# Patient Record
Sex: Female | Born: 1950
Health system: Southern US, Community
[De-identification: ages and names within clinical notes are randomized; demographics above are authoritative.]

## PROBLEM LIST (undated history)

## (undated) DIAGNOSIS — K219 Gastro-esophageal reflux disease without esophagitis: Secondary | ICD-10-CM

## (undated) DIAGNOSIS — I10 Essential (primary) hypertension: Secondary | ICD-10-CM

## (undated) HISTORY — PX: ANKLE SURGERY: SHX546

## (undated) HISTORY — PX: WRIST SURGERY: SHX841

## (undated) HISTORY — PX: TONSILLECTOMY: SUR1361

---

## 2019-11-20 ENCOUNTER — Encounter (INDEPENDENT_AMBULATORY_CARE_PROVIDER_SITE_OTHER): Payer: Self-pay | Admitting: *Deleted

## 2020-02-20 ENCOUNTER — Other Ambulatory Visit (INDEPENDENT_AMBULATORY_CARE_PROVIDER_SITE_OTHER): Payer: Self-pay | Admitting: *Deleted

## 2020-02-20 ENCOUNTER — Encounter (INDEPENDENT_AMBULATORY_CARE_PROVIDER_SITE_OTHER): Payer: Self-pay | Admitting: *Deleted

## 2020-02-22 ENCOUNTER — Other Ambulatory Visit: Payer: Self-pay

## 2020-02-22 ENCOUNTER — Ambulatory Visit (INDEPENDENT_AMBULATORY_CARE_PROVIDER_SITE_OTHER): Payer: Self-pay

## 2020-02-22 ENCOUNTER — Telehealth (INDEPENDENT_AMBULATORY_CARE_PROVIDER_SITE_OTHER): Payer: Self-pay | Admitting: *Deleted

## 2020-02-22 ENCOUNTER — Other Ambulatory Visit (INDEPENDENT_AMBULATORY_CARE_PROVIDER_SITE_OTHER): Payer: Self-pay | Admitting: *Deleted

## 2020-02-22 DIAGNOSIS — Z86010 Personal history of colonic polyps: Secondary | ICD-10-CM

## 2020-02-22 NOTE — Telephone Encounter (Signed)
Referring MD/PCP: hall   Procedure: tcs  Reason/Indication:  Hx polyps  Has patient had this procedure before?  Yes, 2017  If so, when, by whom and where?    Is there a family history of colon cancer?  no  Who?  What age when diagnosed?    Is patient diabetic?   no      Does patient have prosthetic heart valve or mechanical valve?  no  Do you have a pacemaker/defibrillator?  no  Has patient ever had endocarditis/atrial fibrillation? no  Does patient use oxygen? no  Has patient had joint replacement within last 12 months?  no  Is patient constipated or do they take laxatives? no  Does patient have a history of alcohol/drug use?  no  Is patient on blood thinner such as Coumadin, Plavix and/or Aspirin? no  Medications: lisinopril 20 mg daily, metoprolol 50 mg bid, alendronate 70 mg once a week, mvi daily, biotin daily, vit b daily, melatonin, zinc  Allergies: sulfur, thimerosol  Medication Adjustment per Dr Karilyn Cota:   Procedure date & time: 03/21/20

## 2020-02-23 ENCOUNTER — Telehealth (INDEPENDENT_AMBULATORY_CARE_PROVIDER_SITE_OTHER): Payer: Self-pay | Admitting: *Deleted

## 2020-02-23 MED ORDER — SUTAB 1479-225-188 MG PO TABS: 1.0000 | ORAL_TABLET | Freq: Once | ORAL | 0 refills | Status: AC

## 2020-02-23 NOTE — Telephone Encounter (Signed)
Ok to schedule.

## 2020-02-23 NOTE — Telephone Encounter (Signed)
Patient needs Sutab (copay card) ° °

## 2020-03-19 ENCOUNTER — Other Ambulatory Visit: Payer: Self-pay

## 2020-03-19 ENCOUNTER — Other Ambulatory Visit (HOSPITAL_COMMUNITY): Payer: Medicare Other

## 2020-03-19 ENCOUNTER — Other Ambulatory Visit (HOSPITAL_COMMUNITY)
Admission: RE | Admit: 2020-03-19 | Discharge: 2020-03-19 | Disposition: A | Discharge status: Home / Self Care | Payer: Medicare Other | Source: Ambulatory Visit | Attending: Internal Medicine | Admitting: Internal Medicine

## 2020-03-19 DIAGNOSIS — Z01812 Encounter for preprocedural laboratory examination: Secondary | ICD-10-CM | POA: Diagnosis present

## 2020-03-19 DIAGNOSIS — Z20822 Contact with and (suspected) exposure to covid-19: Secondary | ICD-10-CM | POA: Insufficient documentation

## 2020-03-20 LAB — SARS CORONAVIRUS 2 (TAT 6-24 HRS): SARS Coronavirus 2: NEGATIVE

## 2020-03-21 ENCOUNTER — Other Ambulatory Visit: Payer: Self-pay

## 2020-03-21 ENCOUNTER — Encounter (HOSPITAL_COMMUNITY)
Admission: RE | Disposition: A | Discharge status: Home / Self Care | Payer: Self-pay | Source: Home / Self Care | Attending: Internal Medicine

## 2020-03-21 ENCOUNTER — Encounter (HOSPITAL_COMMUNITY): Payer: Self-pay | Admitting: Internal Medicine

## 2020-03-21 ENCOUNTER — Ambulatory Visit (HOSPITAL_COMMUNITY)
Admission: RE | Admit: 2020-03-21 | Discharge: 2020-03-21 | Disposition: A | Discharge status: Home / Self Care | Payer: Medicare Other | Attending: Internal Medicine | Admitting: Internal Medicine

## 2020-03-21 DIAGNOSIS — Z86010 Personal history of colonic polyps: Secondary | ICD-10-CM | POA: Insufficient documentation

## 2020-03-21 DIAGNOSIS — Z7984 Long term (current) use of oral hypoglycemic drugs: Secondary | ICD-10-CM | POA: Diagnosis not present

## 2020-03-21 DIAGNOSIS — E785 Hyperlipidemia, unspecified: Secondary | ICD-10-CM | POA: Diagnosis not present

## 2020-03-21 DIAGNOSIS — K644 Residual hemorrhoidal skin tags: Secondary | ICD-10-CM | POA: Insufficient documentation

## 2020-03-21 DIAGNOSIS — E119 Type 2 diabetes mellitus without complications: Secondary | ICD-10-CM | POA: Insufficient documentation

## 2020-03-21 DIAGNOSIS — Z09 Encounter for follow-up examination after completed treatment for conditions other than malignant neoplasm: Secondary | ICD-10-CM | POA: Diagnosis present

## 2020-03-21 DIAGNOSIS — N9089 Other specified noninflammatory disorders of vulva and perineum: Secondary | ICD-10-CM | POA: Diagnosis not present

## 2020-03-21 DIAGNOSIS — I10 Essential (primary) hypertension: Secondary | ICD-10-CM | POA: Diagnosis not present

## 2020-03-21 DIAGNOSIS — Z881 Allergy status to other antibiotic agents status: Secondary | ICD-10-CM | POA: Diagnosis not present

## 2020-03-21 DIAGNOSIS — M810 Age-related osteoporosis without current pathological fracture: Secondary | ICD-10-CM | POA: Insufficient documentation

## 2020-03-21 DIAGNOSIS — Z882 Allergy status to sulfonamides status: Secondary | ICD-10-CM | POA: Insufficient documentation

## 2020-03-21 DIAGNOSIS — Z79899 Other long term (current) drug therapy: Secondary | ICD-10-CM | POA: Diagnosis not present

## 2020-03-21 DIAGNOSIS — K219 Gastro-esophageal reflux disease without esophagitis: Secondary | ICD-10-CM | POA: Diagnosis not present

## 2020-03-21 HISTORY — PX: COLONOSCOPY: SHX5424

## 2020-03-21 HISTORY — DX: Essential (primary) hypertension: I10

## 2020-03-21 HISTORY — DX: Gastro-esophageal reflux disease without esophagitis: K21.9

## 2020-03-21 LAB — GLUCOSE, CAPILLARY: Glucose-Capillary: 81 mg/dL (ref 70–99)

## 2020-03-21 SURGERY — COLONOSCOPY
Anesthesia: Moderate Sedation

## 2020-03-21 MED ORDER — MIDAZOLAM HCL 5 MG/5ML IJ SOLN
INTRAMUSCULAR | Status: AC
  Filled 2020-03-21: qty 10

## 2020-03-21 MED ORDER — STERILE WATER FOR IRRIGATION IR SOLN
Status: DC | PRN
  Administered 2020-03-21: 100 mL

## 2020-03-21 MED ORDER — MIDAZOLAM HCL 5 MG/5ML IJ SOLN
INTRAMUSCULAR | Status: DC | PRN
  Administered 2020-03-21: 2 mg via INTRAVENOUS
  Administered 2020-03-21 (×2): 1 mg via INTRAVENOUS
  Administered 2020-03-21: 2 mg via INTRAVENOUS

## 2020-03-21 MED ORDER — SODIUM CHLORIDE 0.9 % IV SOLN: INTRAVENOUS | Status: DC

## 2020-03-21 MED ORDER — MEPERIDINE HCL 50 MG/ML IJ SOLN
INTRAMUSCULAR | Status: DC | PRN
  Administered 2020-03-21 (×2): 25 mg via INTRAVENOUS

## 2020-03-21 MED ORDER — MEPERIDINE HCL 50 MG/ML IJ SOLN
INTRAMUSCULAR | Status: AC
  Filled 2020-03-21: qty 1

## 2020-03-21 NOTE — H&P (Signed)
Kathleen Walker is an 69 y.o. female.   Chief Complaint: Patient is here for colonoscopy. HPI: Patient is 69 year old Caucasian female who has a history of colonic polyps and is here for surveillance colonoscopy.  First exam was negative and she had exam 4 years ago when she 3 polyps removed.  He denies abdominal pain change in bowel habits or rectal bleeding. Family history is negative for CRC. Patient does not take aspirin or anticoagulants.  Past Medical History:  Diagnosis Date  . GERD (gastroesophageal reflux disease)   . Hypertension        Osteoporosis.      Hyperlipidemia.      Diabetes mellitus.  Past Surgical History:  Procedure Laterality Date  . ANKLE SURGERY Right    for polio  . TONSILLECTOMY    . WRIST SURGERY Right    fracture wrist, and hand fracture    History reviewed. No pertinent family history. Social History:  reports that she has never smoked. She has never used smokeless tobacco. No history on file for alcohol and drug.  Allergies:  Allergies  Allergen Reactions  . Sulfa Antibiotics     Ears and teeth pain   . Thimerosal     Whites of eyes turn blood red    Medications Prior to Admission  Medication Sig Dispense Refill  . alendronate (FOSAMAX) 70 MG tablet Take 70 mg by mouth every Monday.    Marland Kitchen atorvastatin (LIPITOR) 20 MG tablet Take 20 mg by mouth daily.    . Benfotiamine 150 MG CAPS Take 150 mg by mouth daily.    . Cholecalciferol (VITAMIN D) 50 MCG (2000 UT) tablet Take 2,000 Units by mouth daily.    . famotidine (PEPCID) 20 MG tablet Take 20 mg by mouth at bedtime as needed for heartburn or indigestion.    Marland Kitchen lisinopril (ZESTRIL) 20 MG tablet Take 20 mg by mouth daily.    . Melatonin 3 MG CAPS Take 6 mg by mouth at bedtime as needed (sleep).    . metFORMIN (GLUCOPHAGE) 500 MG tablet Take 500 mg by mouth 2 (two) times daily.    . metoprolol tartrate (LOPRESSOR) 50 MG tablet Take 50 mg by mouth 2 (two) times daily.    . Multiple Vitamin  (MULTIVITAMIN WITH MINERALS) TABS tablet Take 1 tablet by mouth daily.    Marland Kitchen zinc gluconate 50 MG tablet Take 50 mg by mouth 3 (three) times a week.      Results for orders placed or performed during the hospital encounter of 03/21/20 (from the past 48 hour(s))  Glucose, capillary     Status: None   Collection Time: 03/21/20  6:54 AM  Result Value Ref Range   Glucose-Capillary 81 70 - 99 mg/dL    Comment: Glucose reference range applies only to samples taken after fasting for at least 8 hours.   No results found.  Review of Systems  Blood pressure 134/78, temperature 98.1 F (36.7 C), temperature source Oral, resp. rate 16, height 5\' 8"  (1.727 m), SpO2 100 %. Physical Exam  Constitutional: She appears well-developed and well-nourished.  HENT:  Mouth/Throat: Oropharynx is clear and moist.  Eyes: Conjunctivae are normal. No scleral icterus.  Neck: No thyromegaly present.  Cardiovascular: Normal rate, regular rhythm and normal heart sounds.  No murmur heard. Respiratory: Effort normal and breath sounds normal.  GI:  Abdomen is full but soft and nontender with organomegaly or masses.  Musculoskeletal:        General: No edema.  Lymphadenopathy:    She has no cervical adenopathy.  Neurological: She is alert.  Skin: Skin is warm.     Assessment/Plan History of colonic adenomas. Surveillance colonoscopy.  Hildred Laser, MD 03/21/2020, 7:29 AM

## 2020-03-21 NOTE — Discharge Instructions (Signed)
Colonoscopy, Adult, Care After This sheet gives you information about how to care for yourself after your procedure. Your health care provider may also give you more specific instructions. If you have problems or questions, contact your health care provider. What can I expect after the procedure? After the procedure, it is common to have:  A small amount of blood in your stool for 24 hours after the procedure.  Some gas.  Mild cramping or bloating of your abdomen. Follow these instructions at home: Eating and drinking   Drink enough fluid to keep your urine pale yellow.  Follow instructions from your health care provider about eating or drinking restrictions.  Resume your normal diet as instructed by your health care provider. Avoid heavy or fried foods that are hard to digest. Activity  Rest as told by your health care provider.  Avoid sitting for a long time without moving. Get up to take short walks every 1-2 hours. This is important to improve blood flow and breathing. Ask for help if you feel weak or unsteady.  Return to your normal activities as told by your health care provider. Ask your health care provider what activities are safe for you. Managing cramping and bloating   Try walking around when you have cramps or feel bloated.  Apply heat to your abdomen as told by your health care provider. Use the heat source that your health care provider recommends, such as a moist heat pack or a heating pad. ? Place a towel between your skin and the heat source. ? Leave the heat on for 20-30 minutes. ? Remove the heat if your skin turns bright red. This is especially important if you are unable to feel pain, heat, or cold. You may have a greater risk of getting burned. General instructions  For the first 24 hours after the procedure: ? Do not drive or use machinery. ? Do not sign important documents. ? Do not drink alcohol. ? Do your regular daily activities at a slower pace  than normal. ? Eat soft foods that are easy to digest.  Take over-the-counter and prescription medicines only as told by your health care provider.  Keep all follow-up visits as told by your health care provider. This is important. Contact a health care provider if:  You have blood in your stool 2-3 days after the procedure. Get help right away if you have:  More than a small spotting of blood in your stool.  Large blood clots in your stool.  Swelling of your abdomen.  Nausea or vomiting.  A fever.  Increasing pain in your abdomen that is not relieved with medicine. Summary  After the procedure, it is common to have a small amount of blood in your stool. You may also have mild cramping and bloating of your abdomen.  For the first 24 hours after the procedure, do not drive or use machinery, sign important documents, or drink alcohol.  Get help right away if you have a lot of blood in your stool, nausea or vomiting, a fever, or increased pain in your abdomen. This information is not intended to replace advice given to you by your health care provider. Make sure you discuss any questions you have with your health care provider. Document Revised: 05/08/2019 Document Reviewed: 05/08/2019 Elsevier Patient Education  2020 [employer]. Resume usual medications and diet as before. No driving for 24 hours. Next colonoscopy in 5 years.

## 2020-03-21 NOTE — Op Note (Signed)
[facility] Patient Name: Kathleen Walker Procedure Date: 03/21/2020 7:02 AM MRN: [id number] Date of Birth: 12-29-1950 Attending MD: Lionel December , MD CSN: [id number] Age: 69 Admit Type: Outpatient Procedure:                Colonoscopy Indications:              High risk colon cancer surveillance: Personal                            history of colonic polyps Providers:                Lionel December, MD, Edrick Kins, RN, Dyann Ruddle Referring MD:             Catalina Pizza, MD Medicines:                Meperidine 50 mg IV, Midazolam 6 mg IV Complications:            No immediate complications. Estimated Blood Loss:     Estimated blood loss: none. Procedure:                Pre-Anesthesia Assessment:                           - Prior to the procedure, a History and Physical                            was performed, and patient medications and                            allergies were reviewed. The patient's tolerance of                            previous anesthesia was also reviewed. The risks                            and benefits of the procedure and the sedation                            options and risks were discussed with the patient.                            All questions were answered, and informed consent                            was obtained. Prior Anticoagulants: The patient has                            taken no previous anticoagulant or antiplatelet                            agents. ASA Grade Assessment: II - A patient with                            mild systemic disease. After reviewing the risks  and benefits, the patient was deemed in                            satisfactory condition to undergo the procedure.                           After obtaining informed consent, the colonoscope                            was passed under direct vision. Throughout the                            procedure, the patient's blood pressure, pulse, and                             oxygen saturations were monitored continuously. The                            [id number]-[id number] ([id number]) scope was introduced through                            the anus and advanced to the the cecum, identified                            by appendiceal orifice and ileocecal valve. The                            colonoscopy was performed without difficulty. The                            patient tolerated the procedure well. The quality                            of the bowel preparation was excellent. The                            ileocecal valve, appendiceal orifice, and rectum                            were photographed. Scope In: 7:37:43 AM Scope Out: 7:52:15 AM Scope Withdrawal Time: 0 hours 8 minutes 24 seconds  Total Procedure Duration: 0 hours 14 minutes 32 seconds  Findings:      Skin tags were found on perianal exam.      The colon (entire examined portion) appeared normal.      External hemorrhoids were found during retroflexion. The hemorrhoids       were small. Impression:               - Perianal skin tags found on perianal exam.                           - The entire examined colon is normal.                           - External hemorrhoids.                           -  No specimens collected. Moderate Sedation:      Moderate (conscious) sedation was administered by the endoscopy nurse       and supervised by the endoscopist. The following parameters were       monitored: oxygen saturation, heart rate, blood pressure, CO2       capnography and response to care. Total physician intraservice time was       19 minutes. Recommendation:           - Patient has a contact number available for                            emergencies. The signs and symptoms of potential                            delayed complications were discussed with the                            patient. Return to normal activities tomorrow.                            Written  discharge instructions were provided to the                            patient.                           - Resume previous diet today.                           - Continue present medications.                           - Repeat colonoscopy in 5 years for surveillance. Procedure Code(s):        --- Professional ---                           [phone], Colonoscopy, flexible; diagnostic, including                            collection of specimen(s) by brushing or washing,                            when performed (separate procedure)                           G0500, Moderate sedation services provided by the                            same physician or other qualified health care                            professional performing a gastrointestinal                            endoscopic service that sedation supports,  requiring the presence of an independent trained                            observer to assist in the monitoring of the                            patient's level of consciousness and physiological                            status; initial 15 minutes of intra-service time;                            patient age 48 years or older (additional time may                            be reported with 83662, as appropriate) Diagnosis Code(s):        --- Professional ---                           K64.4, Residual hemorrhoidal skin tags                           Z86.010, Personal history of colonic polyps CPT copyright 2019 American Medical Association. All rights reserved. The codes documented in this report are preliminary and upon coder review may  be revised to meet current compliance requirements. Lionel December, MD Lionel December, MD 03/21/2020 8:04:04 AM This report has been signed electronically. Number of Addenda: 0

## 2020-06-27 ENCOUNTER — Other Ambulatory Visit (HOSPITAL_COMMUNITY): Payer: Self-pay | Admitting: Internal Medicine

## 2020-06-27 DIAGNOSIS — Z1231 Encounter for screening mammogram for malignant neoplasm of breast: Secondary | ICD-10-CM

## 2021-05-22 ENCOUNTER — Other Ambulatory Visit (HOSPITAL_COMMUNITY): Payer: Self-pay | Admitting: Student

## 2021-05-22 DIAGNOSIS — M810 Age-related osteoporosis without current pathological fracture: Secondary | ICD-10-CM

## 2021-05-23 ENCOUNTER — Other Ambulatory Visit (HOSPITAL_COMMUNITY): Payer: Self-pay | Admitting: Student

## 2021-05-23 DIAGNOSIS — Z1231 Encounter for screening mammogram for malignant neoplasm of breast: Secondary | ICD-10-CM

## 2021-06-02 ENCOUNTER — Ambulatory Visit (HOSPITAL_COMMUNITY)
Admission: RE | Admit: 2021-06-02 | Discharge: 2021-06-02 | Disposition: A | Discharge status: Home / Self Care | Payer: Medicare Other | Source: Ambulatory Visit | Attending: Student | Admitting: Student

## 2021-06-02 ENCOUNTER — Other Ambulatory Visit: Payer: Self-pay

## 2021-06-02 DIAGNOSIS — Z780 Asymptomatic menopausal state: Secondary | ICD-10-CM | POA: Diagnosis not present

## 2021-06-02 DIAGNOSIS — Z1231 Encounter for screening mammogram for malignant neoplasm of breast: Secondary | ICD-10-CM | POA: Diagnosis not present

## 2021-06-02 DIAGNOSIS — M810 Age-related osteoporosis without current pathological fracture: Secondary | ICD-10-CM | POA: Insufficient documentation

## 2021-06-02 DIAGNOSIS — Z13820 Encounter for screening for osteoporosis: Secondary | ICD-10-CM | POA: Insufficient documentation

## 2021-06-05 ENCOUNTER — Inpatient Hospital Stay
Admission: RE | Admit: 2021-06-05 | Discharge: 2021-06-05 | Disposition: A | Discharge status: Home / Self Care | Payer: Self-pay | Source: Ambulatory Visit | Attending: Student | Admitting: Student

## 2021-06-05 ENCOUNTER — Other Ambulatory Visit (HOSPITAL_COMMUNITY): Payer: Self-pay | Admitting: Student

## 2021-06-05 DIAGNOSIS — Z1231 Encounter for screening mammogram for malignant neoplasm of breast: Secondary | ICD-10-CM

## 2021-06-19 ENCOUNTER — Other Ambulatory Visit: Payer: Self-pay

## 2021-06-19 ENCOUNTER — Encounter (HOSPITAL_COMMUNITY)
Admission: RE | Admit: 2021-06-19 | Discharge: 2021-06-19 | Disposition: A | Discharge status: Home / Self Care | Payer: Medicare Other | Source: Ambulatory Visit | Attending: Internal Medicine | Admitting: Internal Medicine

## 2021-06-19 ENCOUNTER — Encounter (HOSPITAL_COMMUNITY): Payer: Self-pay

## 2021-06-19 DIAGNOSIS — M810 Age-related osteoporosis without current pathological fracture: Secondary | ICD-10-CM | POA: Diagnosis present

## 2021-06-19 MED ORDER — DENOSUMAB 60 MG/ML ~~LOC~~ SOSY
60.0000 mg | PREFILLED_SYRINGE | Freq: Once | SUBCUTANEOUS | Status: AC
  Administered 2021-06-19: 60 mg via SUBCUTANEOUS
  Filled 2021-06-19: qty 1

## 2021-09-13 ENCOUNTER — Other Ambulatory Visit: Payer: Self-pay

## 2021-09-13 ENCOUNTER — Ambulatory Visit
Admission: RE | Admit: 2021-09-13 | Discharge: 2021-09-13 | Disposition: A | Discharge status: Home / Self Care | Payer: Medicare Other | Source: Ambulatory Visit | Attending: Physician Assistant | Admitting: Physician Assistant

## 2021-09-13 VITALS — BP 176/92 | HR 77 | Temp 98.5°F | Resp 16

## 2021-09-13 DIAGNOSIS — N3000 Acute cystitis without hematuria: Secondary | ICD-10-CM | POA: Insufficient documentation

## 2021-09-13 LAB — POCT URINALYSIS DIP (MANUAL ENTRY)
Bilirubin, UA: NEGATIVE
Glucose, UA: NEGATIVE mg/dL
Ketones, POC UA: NEGATIVE mg/dL
Nitrite, UA: NEGATIVE
Protein Ur, POC: NEGATIVE mg/dL
Spec Grav, UA: >=1.03 — AB (ref 1.010–1.025)
Urobilinogen, UA: 0.2 E.U./dL
pH, UA: 5.5 (ref 5.0–8.0)

## 2021-09-13 MED ORDER — NITROFURANTOIN MONOHYD MACRO 100 MG PO CAPS: 100.0000 mg | ORAL_CAPSULE | Freq: Two times a day (BID) | ORAL | 0 refills | Status: DC

## 2021-09-13 NOTE — ED Provider Notes (Signed)
[facility]    CSN: [id number] Arrival date & time: 09/13/21  1340      History   Chief Complaint Chief Complaint  Patient presents with   Dysuria   Abdominal Pain    HPI Kathleen Walker is a 70 y.o. female.   Pt complains of increased urinary frequency, dysuria, and lower abdominal discomfort that started about 5 days ago.  Denies flank pain, fever, chills, n/v/d.  She has been drinking cranberry juice.    Past Medical History:  Diagnosis Date   GERD (gastroesophageal reflux disease)    Hypertension     There are no problems to display for this patient.   Past Surgical History:  Procedure Laterality Date   ANKLE SURGERY Right    for polio   COLONOSCOPY N/A 03/21/2020   Procedure: COLONOSCOPY;  Surgeon: Rogene Houston, MD;  Location: [facility];  Service: Endoscopy;  Laterality: N/A;  730   TONSILLECTOMY     WRIST SURGERY Right    fracture wrist, and hand fracture    OB History   No obstetric history on file.      Home Medications    Prior to Admission medications   Medication Sig Start Date End Date Taking? Authorizing Provider  atorvastatin (LIPITOR) 20 MG tablet Take 20 mg by mouth daily. 03/03/20  Yes [provider]  Cholecalciferol (VITAMIN D) 50 MCG (2000 UT) tablet Take 2,000 Units by mouth daily.   Yes [provider]  lisinopril (ZESTRIL) 20 MG tablet Take 20 mg by mouth daily. 01/26/20  Yes [provider]  Melatonin 3 MG CAPS Take 6 mg by mouth at bedtime as needed (sleep).   Yes [provider]  metFORMIN (GLUCOPHAGE) 500 MG tablet Take 500 mg by mouth 2 (two) times daily. 03/03/20  Yes [provider]  metoprolol tartrate (LOPRESSOR) 50 MG tablet Take 50 mg by mouth 2 (two) times daily. 01/19/20  Yes [provider]  Multiple Vitamin (MULTIVITAMIN WITH MINERALS) TABS tablet Take 1 tablet by mouth daily.   Yes [provider]  nitrofurantoin, macrocrystal-monohydrate,  (MACROBID) 100 MG capsule Take 1 capsule (100 mg total) by mouth 2 (two) times daily. 09/13/21  Yes Ward, Lenise Arena, PA-C  zinc gluconate 50 MG tablet Take 50 mg by mouth 3 (three) times a week.   Yes [provider]  alendronate (FOSAMAX) 70 MG tablet Take 70 mg by mouth every Monday. 01/28/20   [provider]  Benfotiamine 150 MG CAPS Take 150 mg by mouth daily.    [provider]  famotidine (PEPCID) 20 MG tablet Take 20 mg by mouth at bedtime as needed for heartburn or indigestion.    [provider]    Family History History reviewed. No pertinent family history.  Social History Social History   Tobacco Use   Smoking status: Never   Smokeless tobacco: Never     Allergies   Sulfa antibiotics and Thimerosal   Review of Systems Review of Systems  Constitutional:  Negative for chills and fever.  HENT:  Negative for ear pain and sore throat.   Eyes:  Negative for pain and visual disturbance.  Respiratory:  Negative for cough and shortness of breath.   Cardiovascular:  Negative for chest pain and palpitations.  Gastrointestinal:  Negative for abdominal pain and vomiting.  Genitourinary:  Positive for difficulty urinating, frequency and urgency. Negative for dysuria, flank pain and hematuria.  Musculoskeletal:  Negative for arthralgias and back pain.  Skin:  Negative for color change and rash.  Neurological:  Negative for seizures and syncope.  All other systems reviewed and are negative.   Physical Exam Triage Vital Signs ED Triage Vitals  Enc Vitals Group     BP 09/13/21 1422 (!) 176/92     Pulse Rate 09/13/21 1422 77     Resp 09/13/21 1422 16     Temp 09/13/21 1422 98.5 F (36.9 C)     Temp Source 09/13/21 1422 Oral     SpO2 09/13/21 1422 96 %     Weight --      Height --      Head Circumference --      Peak Flow --      Pain Score 09/13/21 1419 2     Pain Loc --      Pain Edu? --      Excl. in GC? --    No data  found.  Updated Vital Signs BP (!) 176/92 (BP Location: Right Arm)   Pulse 77   Temp 98.5 F (36.9 C) (Oral)   Resp 16   SpO2 96%   Visual Acuity Right Eye Distance:   Left Eye Distance:   Bilateral Distance:    Right Eye Near:   Left Eye Near:    Bilateral Near:     Physical Exam Vitals and nursing note reviewed.  Constitutional:      General: She is not in acute distress.    Appearance: She is well-developed.  HENT:     Head: Normocephalic and atraumatic.  Eyes:     Conjunctiva/sclera: Conjunctivae normal.  Cardiovascular:     Rate and Rhythm: Normal rate and regular rhythm.     Heart sounds: No murmur heard. Pulmonary:     Effort: Pulmonary effort is normal. No respiratory distress.     Breath sounds: Normal breath sounds.  Abdominal:     Palpations: Abdomen is soft.     Tenderness: There is no abdominal tenderness.  Musculoskeletal:        General: No swelling.     Cervical back: Neck supple.  Skin:    General: Skin is warm and dry.     Capillary Refill: Capillary refill takes less than 2 seconds.  Neurological:     Mental Status: She is alert.  Psychiatric:        Mood and Affect: Mood normal.     UC Treatments / Results  Labs (all labs ordered are listed, but only abnormal results are displayed) Labs Reviewed  POCT URINALYSIS DIP (MANUAL ENTRY) - Abnormal; Notable for the following components:      Result Value   Spec Grav, UA >=1.030 (*)    Blood, UA small (*)    Leukocytes, UA Small (1+) (*)    All other components within normal limits    EKG   Radiology No results found.  Procedures Procedures (including critical care time)  Medications Ordered in UC Medications - No data to display  Initial Impression / Assessment and Plan / UC Course  I have reviewed the triage vital signs and the nursing notes.  Pertinent labs & imaging results that were available during my care of the patient were reviewed by me and considered in my medical  decision making (see chart for details).     UTI, macrobid prescribed. Urine culture sent.  Return precautions discussed.  Final Clinical Impressions(s) / UC Diagnoses   Final diagnoses:  Acute cystitis without hematuria  Discharge Instructions      Take medication as prescribed Drink plenty of fluids Return if no improvement    ED Prescriptions     Medication Sig Dispense Auth. Provider   nitrofurantoin, macrocrystal-monohydrate, (MACROBID) 100 MG capsule Take 1 capsule (100 mg total) by mouth 2 (two) times daily. 10 capsule Ward, Lenise Arena, PA-C      PDMP not reviewed this encounter.   Ward, Lenise Arena, PA-C 09/13/21 1446

## 2021-09-13 NOTE — Discharge Instructions (Signed)
Take medication as prescribed Drink plenty of fluids Return if no improvement

## 2021-09-13 NOTE — ED Triage Notes (Signed)
Patient c/o dysuria and ABD pressure x 5 days.   Patient denies back pain,fever, or N/V/D.   Patient has used cranberry juice with no relief of symptoms.

## 2021-09-16 LAB — URINE CULTURE: Culture: >=100000 — AB

## 2021-12-23 ENCOUNTER — Encounter (HOSPITAL_COMMUNITY)
Admission: RE | Admit: 2021-12-23 | Discharge: 2021-12-23 | Disposition: A | Discharge status: Home / Self Care | Payer: Medicare Other | Source: Ambulatory Visit | Attending: Internal Medicine | Admitting: Internal Medicine

## 2021-12-23 DIAGNOSIS — M810 Age-related osteoporosis without current pathological fracture: Secondary | ICD-10-CM | POA: Diagnosis not present

## 2021-12-23 MED ORDER — DENOSUMAB 60 MG/ML ~~LOC~~ SOSY: 60.0000 mg | PREFILLED_SYRINGE | Freq: Once | SUBCUTANEOUS | Status: AC

## 2021-12-23 MED ORDER — DENOSUMAB 60 MG/ML ~~LOC~~ SOSY
PREFILLED_SYRINGE | SUBCUTANEOUS | Status: AC
  Administered 2021-12-23: 60 mg
  Filled 2021-12-23: qty 1

## 2022-04-21 ENCOUNTER — Other Ambulatory Visit (HOSPITAL_COMMUNITY): Payer: Self-pay | Admitting: Internal Medicine

## 2022-04-21 DIAGNOSIS — Z1231 Encounter for screening mammogram for malignant neoplasm of breast: Secondary | ICD-10-CM

## 2022-06-04 ENCOUNTER — Ambulatory Visit (HOSPITAL_COMMUNITY)
Admission: RE | Admit: 2022-06-04 | Discharge: 2022-06-04 | Disposition: A | Discharge status: Home / Self Care | Payer: Medicare Other | Source: Ambulatory Visit | Attending: Internal Medicine | Admitting: Internal Medicine

## 2022-06-04 DIAGNOSIS — Z1231 Encounter for screening mammogram for malignant neoplasm of breast: Secondary | ICD-10-CM | POA: Diagnosis present

## 2022-06-23 ENCOUNTER — Encounter (HOSPITAL_COMMUNITY): Payer: Medicare Other

## 2022-06-23 ENCOUNTER — Other Ambulatory Visit: Payer: Self-pay

## 2022-06-23 ENCOUNTER — Encounter (HOSPITAL_COMMUNITY)
Admission: RE | Admit: 2022-06-23 | Discharge: 2022-06-23 | Disposition: A | Discharge status: Home / Self Care | Payer: Medicare Other | Source: Ambulatory Visit | Attending: Internal Medicine | Admitting: Internal Medicine

## 2022-06-23 DIAGNOSIS — M810 Age-related osteoporosis without current pathological fracture: Secondary | ICD-10-CM | POA: Diagnosis not present

## 2022-06-23 MED ORDER — DENOSUMAB 60 MG/ML ~~LOC~~ SOSY
60.0000 mg | PREFILLED_SYRINGE | Freq: Once | SUBCUTANEOUS | Status: AC
  Administered 2022-06-23: 60 mg via SUBCUTANEOUS

## 2022-06-23 NOTE — Progress Notes (Signed)
Diagnosis: Osteoporosis  Provider:  Zack Hall MD  Procedure: Injection  Prolia (Denosumab), Dose: 60 mg, Site: subcutaneous, Number of injections: 1  Discharge: Condition: Good, Destination: Home . AVS provided to patient.   Performed by:  Dariane Natzke, RN        

## 2022-07-12 IMAGING — MG MM DIGITAL SCREENING BILAT W/ TOMO AND CAD
8 series · 8 of 24 positions shown · non-contrast
Comparison: Previous exam(s).

CLINICAL DATA: Screening.

EXAM:
DIGITAL SCREENING BILATERAL MAMMOGRAM WITH TOMOSYNTHESIS AND CAD
TECHNIQUE: Bilateral screening digital craniocaudal and mediolateral oblique
mammograms were obtained. Bilateral screening digital breast
tomosynthesis was performed. The images were evaluated with
computer-aided detection.

[R CC synth-2D]
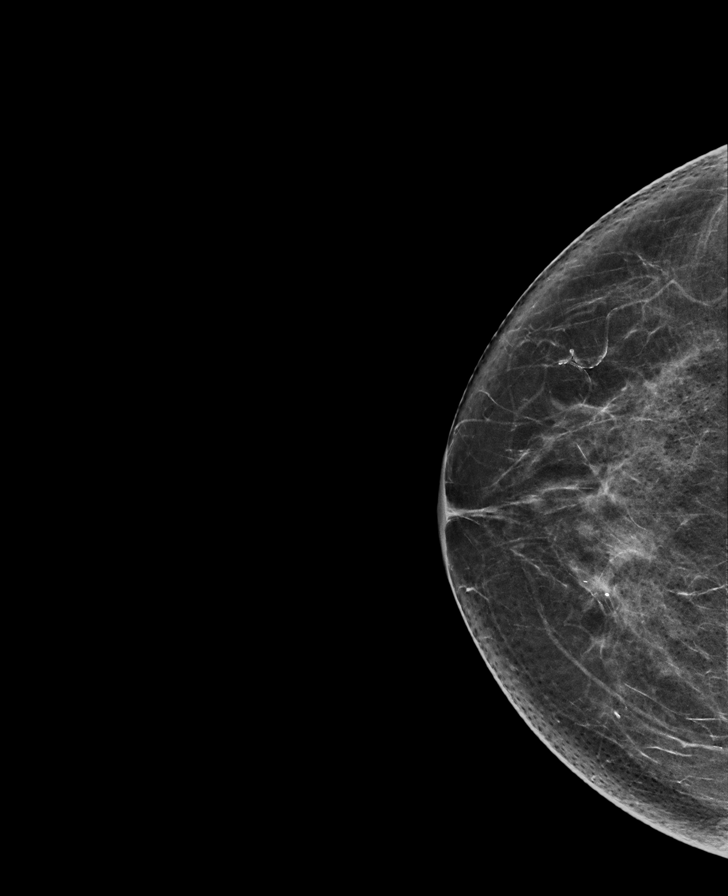

[L MLO synth-2D]
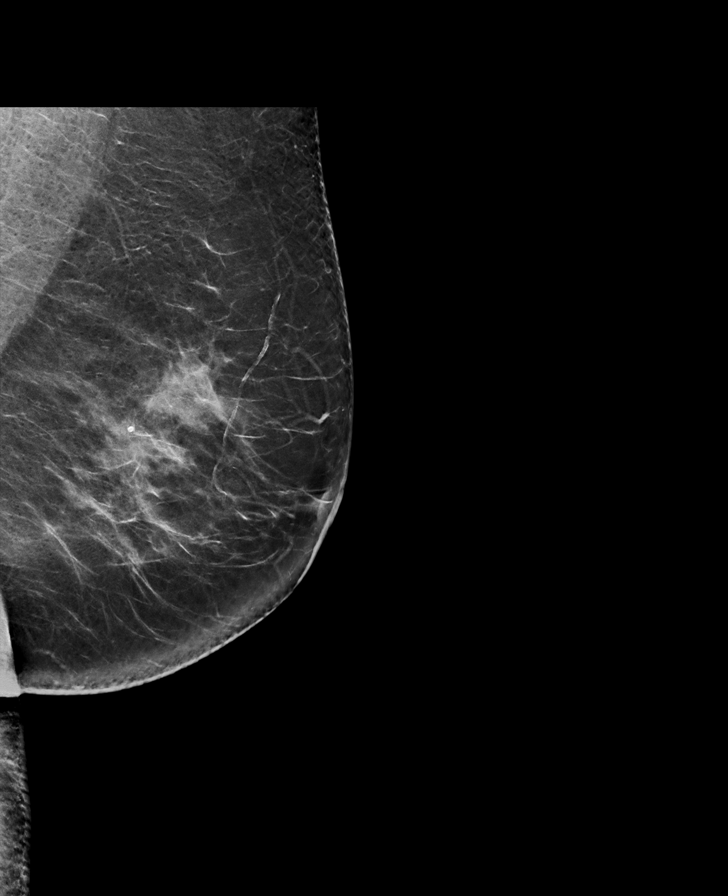

[L CC synth-2D]
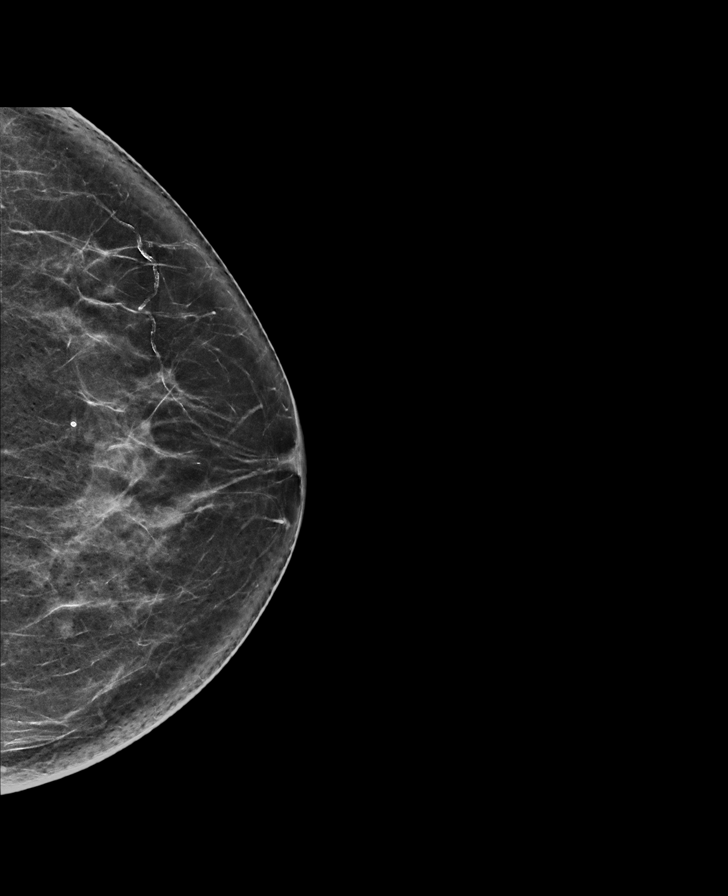

[R MLO synth-2D]
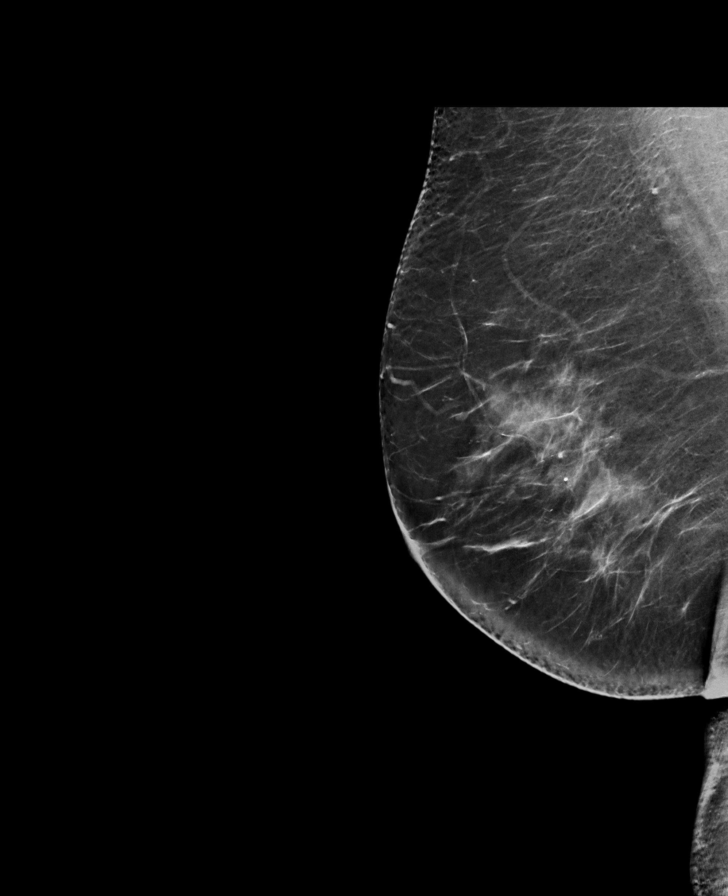

[L CC tomo · tomo slice 37/72.0]
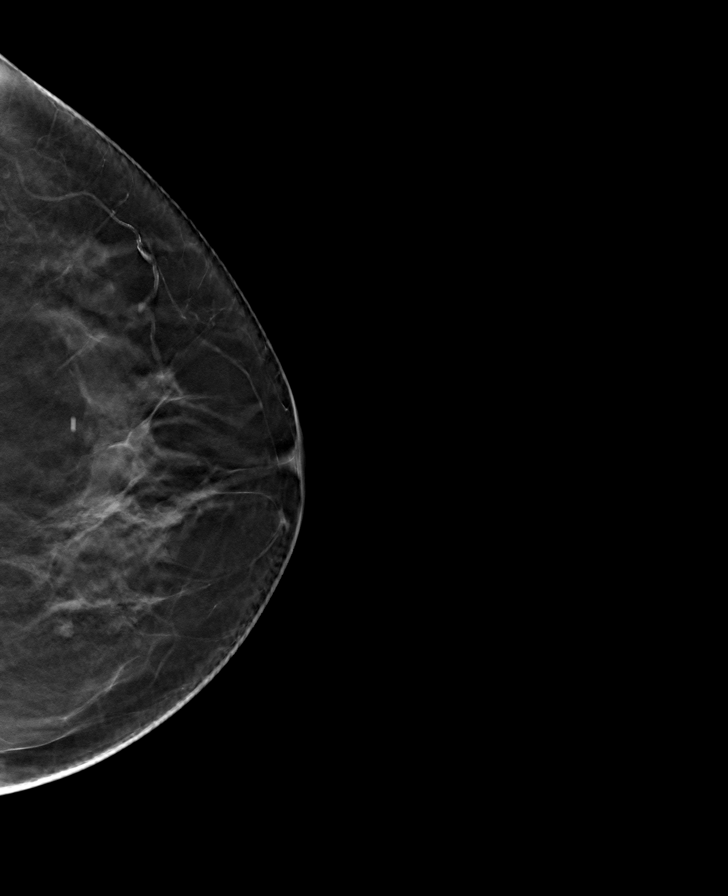

[L MLO tomo · tomo slice 42/83.0]
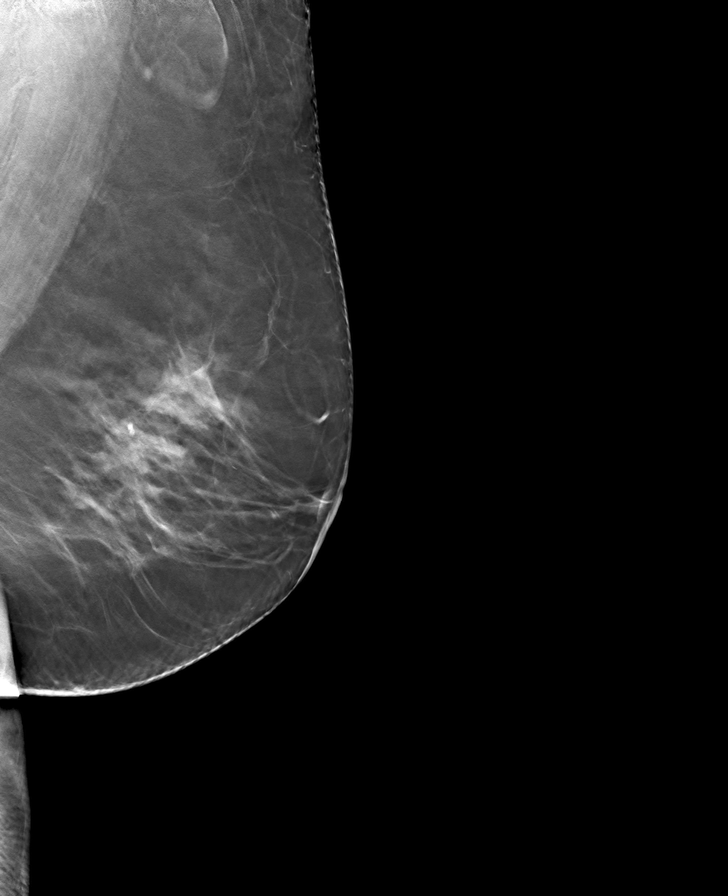

[R CC tomo · tomo slice 36/71.0]
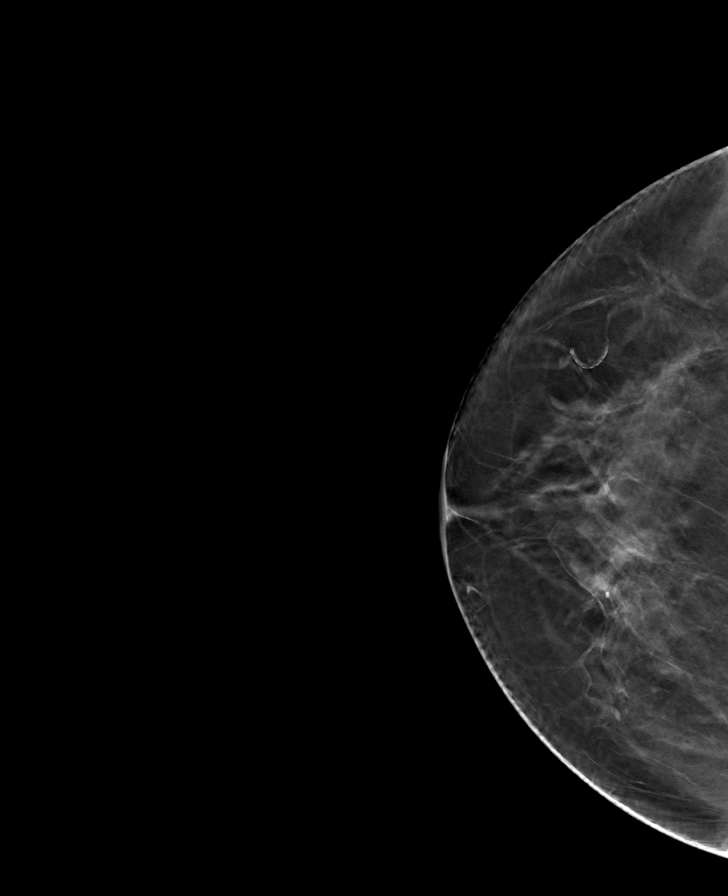

[R MLO tomo · tomo slice 43/86.0]
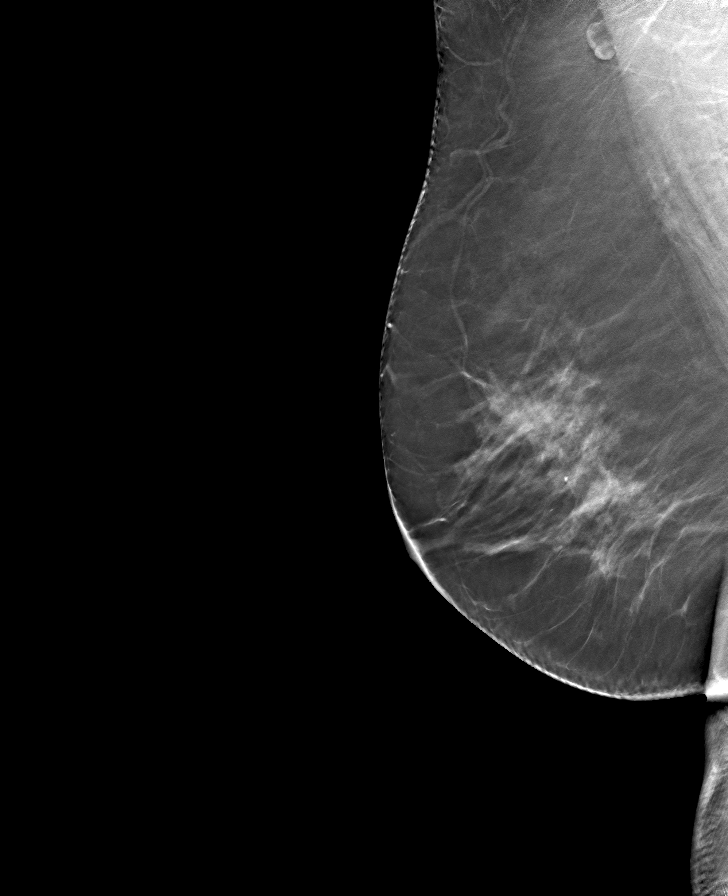

[8 of 24 positions shown; findings below may reference images not displayed]

ACR Breast Density Category c: The breast tissue is heterogeneously
dense, which may obscure small masses.
FINDINGS: There are no findings suspicious for malignancy.
IMPRESSION: No mammographic evidence of malignancy. A result letter of this
screening mammogram will be mailed directly to the patient.

RECOMMENDATION:
Screening mammogram in one year. (Code:Q3-W-BC3)

BI-RADS CATEGORY  1: Negative.

## 2022-12-01 ENCOUNTER — Other Ambulatory Visit: Payer: Self-pay

## 2022-12-15 ENCOUNTER — Encounter (HOSPITAL_COMMUNITY): Payer: Self-pay | Admitting: Internal Medicine

## 2022-12-16 ENCOUNTER — Telehealth: Payer: Self-pay | Admitting: Pharmacy Technician

## 2022-12-16 NOTE — Telephone Encounter (Signed)
Auth Submission: NO AUTH NEEDED @ [facility] Payer: MEDICARE A/B & BCBS SUPP Medication & CPT/J Code(s) submitted: Prolia (Denosumab) [id number] Route of submission (phone, fax, portal):  Phone # Fax # Auth type: Buy/Bill Units/visits requested: 2 Reference number:  Approval from: 12/16/22 to 12/17/23

## 2022-12-22 ENCOUNTER — Encounter (HOSPITAL_COMMUNITY): Payer: Self-pay | Admitting: Internal Medicine

## 2022-12-22 ENCOUNTER — Encounter (HOSPITAL_COMMUNITY)
Admission: RE | Admit: 2022-12-22 | Discharge: 2022-12-22 | Disposition: A | Discharge status: Home / Self Care | Payer: Medicare Other | Source: Ambulatory Visit | Attending: Internal Medicine | Admitting: Internal Medicine

## 2022-12-22 VITALS — BP 158/75 | HR 63 | Temp 97.3°F | Resp 16

## 2022-12-22 DIAGNOSIS — M810 Age-related osteoporosis without current pathological fracture: Secondary | ICD-10-CM | POA: Diagnosis present

## 2022-12-22 MED ORDER — DENOSUMAB 60 MG/ML ~~LOC~~ SOSY
60.0000 mg | PREFILLED_SYRINGE | Freq: Once | SUBCUTANEOUS | Status: AC
  Administered 2022-12-22: 60 mg via SUBCUTANEOUS

## 2022-12-22 NOTE — Addendum Note (Signed)
Encounter addended by: Baxter Hire, RN on: 12/22/2022 9:59 AM  Actions taken: Therapy plan modified

## 2022-12-22 NOTE — Progress Notes (Signed)
Diagnosis: Osteoporosis  Provider:  Wende Neighbors MD  Procedure: Injection  Prolia (Denosumab), Dose: 60 mg, Site: subcutaneous, Number of injections: 1  Post Care: Patient declined observation  Discharge: Condition: Good, Destination: Home . AVS Provided  Performed by:  Binnie Kand, RN

## 2023-02-01 ENCOUNTER — Other Ambulatory Visit: Payer: Self-pay | Admitting: *Deleted

## 2023-02-01 DIAGNOSIS — L720 Epidermal cyst: Secondary | ICD-10-CM

## 2023-02-02 ENCOUNTER — Ambulatory Visit (INDEPENDENT_AMBULATORY_CARE_PROVIDER_SITE_OTHER): Payer: Medicare Other | Admitting: Surgery

## 2023-02-02 ENCOUNTER — Encounter: Payer: Self-pay | Admitting: Surgery

## 2023-02-02 VITALS — BP 149/76 | HR 54 | Temp 98.3°F | Resp 12 | Ht 68.0 in | Wt 178.0 lb

## 2023-02-02 DIAGNOSIS — L720 Epidermal cyst: Secondary | ICD-10-CM | POA: Diagnosis not present

## 2023-02-02 NOTE — Progress Notes (Signed)
[facility] History and Physical  Reason for Referral: Back cyst Referring Physician: Dr. Margo AyeHall  Chief Complaint   New Patient (Initial Visit)     Ramond DialConnie Mahn is a 72 y.o. female.  HPI: Patient presents for evaluation of a back cyst.  She first noticed it in January.  At that time it was small and sticking out, so she tried to express the cyst.  She obtained some clear fatty drainage.  After expressing the cyst, it became swollen and tender.  It took about 2 months for the cyst to go down in size.  She denies taking any antibiotics or receiving any treatment for the cyst at this time.  He currently is back to its original size.  She denies any drainage from the area.  She denies any significant pain or redness.  She denies any fevers or chills.  She has never had a cyst previously.  Her past medical history significant for diabetes, hypertension, and hyperlipidemia.  She denies use of blood thinning medications.  She has never had abdominal surgeries, cyst excision or incision and drainage of any abscesses.  She will rarely drink alcohol and takes nightly marijuana Gummies to help with sleep.  She denies use of tobacco products.  Past Medical History:  Diagnosis Date   GERD (gastroesophageal reflux disease)    Hypertension     Past Surgical History:  Procedure Laterality Date   ANKLE SURGERY Right    for polio   COLONOSCOPY N/A 03/21/2020   Procedure: COLONOSCOPY;  Surgeon: Malissa Hippoehman, Najeeb U, MD;  Location: [facility];  Service: Endoscopy;  Laterality: N/A;  730   TONSILLECTOMY     WRIST SURGERY Right    fracture wrist, and hand fracture    No family history on file.  Social History   Tobacco Use   Smoking status: Never   Smokeless tobacco: Never    Medications: I have reviewed the patient's current medications. Allergies as of 02/02/2023       Reactions   Sulfa Antibiotics    Ears and teeth pain    Thimerosal (thiomersal)    Whites of eyes turn blood  red        Medication List        Accurate as of February 02, 2023  9:37 AM. If you have any questions, ask your nurse or doctor.          STOP taking these medications    alendronate 70 MG tablet Commonly known as: FOSAMAX Stopped by: Brienne Liguori A Marin Wisner, DO   famotidine 20 MG tablet Commonly known as: PEPCID Stopped by: Tuvia Woodrick A Brittlyn Cloe, DO   Melatonin 3 MG Caps Stopped by: Durrell Barajas A Verlie Liotta, DO   metFORMIN 500 MG tablet Commonly known as: GLUCOPHAGE Stopped by: Myeshia Fojtik A Deavon Podgorski, DO   nitrofurantoin (macrocrystal-monohydrate) 100 MG capsule Commonly known as: MACROBID Stopped by: Marieli Rudy A Madsen Riddle, DO       TAKE these medications    atorvastatin 20 MG tablet Commonly known as: LIPITOR Take 20 mg by mouth daily.   Benfotiamine 150 MG Caps Take 150 mg by mouth daily.   lisinopril 20 MG tablet Commonly known as: ZESTRIL Take 20 mg by mouth daily.   metoprolol tartrate 50 MG tablet Commonly known as: LOPRESSOR Take 50 mg by mouth 2 (two) times daily.   multivitamin with minerals Tabs tablet Take 1 tablet by mouth daily.   Vitamin D 50 MCG (2000 UT) tablet Take 2,000 Units by mouth daily.  zinc gluconate 50 MG tablet Take 50 mg by mouth 3 (three) times a week.         ROS:  Constitutional: negative for chills, fatigue, and fevers Eyes: negative for visual disturbance and pain Ears, nose, mouth, throat, and face: negative for ear drainage, sore throat, and sinus problems Respiratory: negative for cough, wheezing, and shortness of breath Cardiovascular: negative for chest pain and palpitations Gastrointestinal: negative for abdominal pain, nausea, reflux symptoms, and vomiting Genitourinary:negative for dysuria and frequency Integument/breast: negative for dryness and rash Hematologic/lymphatic: negative for bleeding and lymphadenopathy Musculoskeletal:negative for back pain and neck pain Neurological: negative for  dizziness and tremors Endocrine: negative for temperature intolerance  Blood pressure (!) 149/76, pulse (!) 54, temperature 98.3 F (36.8 C), temperature source Oral, resp. rate 12, height 5\' 8"  (1.727 m), weight 178 lb (80.7 kg), SpO2 98 %. Physical Exam Vitals reviewed.  Constitutional:      Appearance: Normal appearance.  HENT:     Head: Normocephalic and atraumatic.  Eyes:     Extraocular Movements: Extraocular movements intact.     Pupils: Pupils are equal, round, and reactive to light.  Cardiovascular:     Rate and Rhythm: Normal rate and regular rhythm.  Pulmonary:     Effort: Pulmonary effort is normal.     Breath sounds: Normal breath sounds.  Abdominal:     General: There is no distension.     Palpations: Abdomen is soft.     Tenderness: There is no abdominal tenderness.  Musculoskeletal:        General: Normal range of motion.     Cervical back: Normal range of motion.  Skin:    General: Skin is warm and dry.     Comments: 2 cm back cyst on right flank, nontender to palpation, no active drainage, no erythema or induration  Neurological:     General: No focal deficit present.     Mental Status: She is alert and oriented to person, place, and time.  Psychiatric:        Mood and Affect: Mood normal.        Behavior: Behavior normal.    Results: No results found for this or any previous visit (from the past 48 hour(s)).  No results found.   Assessment & Plan:  Ramond DialConnie Quito is a 72 y.o. female who presents for evaluation of the right posterior back cyst.  -I explained the pathophysiology of cysts, I would recommend surgical excision if they are causing pain for has been infected in the past. -The risk and benefits of right back cyst excision were discussed including but not limited to bleeding, infection, injury to surrounding structures, and need for additional procedures.  After careful consideration, Ramond DialConnie Tingler has decided to proceed with surgery. -Patient  tentatively scheduled for surgery on 4/17 -Information provided to the patient regarding sebaceous cysts -Advised her to call or follow-up if the area becomes red, painful, or starts draining pus  All questions were answered to the satisfaction of the patient.  Theophilus Kindsatherine Emersynn Deatley, DO [facility] [address] [place], [place] [phone] (office)

## 2023-02-05 NOTE — H&P (Signed)
[facility] History and Physical  Reason for Referral: Back cyst Referring Physician: Dr. Margo Aye  Chief Complaint   New Patient (Initial Visit)     Kathleen Walker is a 72 y.o. female.  HPI: Patient presents for evaluation of a back cyst.  She first noticed it in January.  At that time it was small and sticking out, so she tried to express the cyst.  She obtained some clear fatty drainage.  After expressing the cyst, it became swollen and tender.  It took about 2 months for the cyst to go down in size.  She denies taking any antibiotics or receiving any treatment for the cyst at this time.  He currently is back to its original size.  She denies any drainage from the area.  She denies any significant pain or redness.  She denies any fevers or chills.  She has never had a cyst previously.  Her past medical history significant for diabetes, hypertension, and hyperlipidemia.  She denies use of blood thinning medications.  She has never had abdominal surgeries, cyst excision or incision and drainage of any abscesses.  She will rarely drink alcohol and takes nightly marijuana Gummies to help with sleep.  She denies use of tobacco products.  Past Medical History:  Diagnosis Date   GERD (gastroesophageal reflux disease)    Hypertension     Past Surgical History:  Procedure Laterality Date   ANKLE SURGERY Right    for polio   COLONOSCOPY N/A 03/21/2020   Procedure: COLONOSCOPY;  Surgeon: Malissa Hippo, MD;  [facility]: [facility];  Service: Endoscopy;  Laterality: N/A;  730   TONSILLECTOMY     WRIST SURGERY Right    fracture wrist, and hand fracture    No family history on file.  Social History   Tobacco Use   Smoking status: Never   Smokeless tobacco: Never    Medications: I have reviewed the patient's current medications. Allergies as of 02/02/2023       Reactions   Sulfa Antibiotics    Ears and teeth pain    Thimerosal (thiomersal)    Whites of eyes turn blood  red        Medication List        Accurate as of February 02, 2023  9:37 AM. If you have any questions, ask your nurse or doctor.          STOP taking these medications    alendronate 70 MG tablet Commonly known as: FOSAMAX Stopped by: [place] Blizzard A Bilaal Leib, DO   famotidine 20 MG tablet Commonly known as: PEPCID Stopped by: Tam Delisle A Marten Iles, DO   Melatonin 3 MG Caps Stopped by: Namiyah Grantham A Jorita Bohanon, DO   metFORMIN 500 MG tablet Commonly known as: GLUCOPHAGE Stopped by: Waverly Chavarria A Rawleigh Rode, DO   nitrofurantoin (macrocrystal-monohydrate) 100 MG capsule Commonly known as: MACROBID Stopped by: Belenda Alviar A Zavon Hyson, DO       TAKE these medications    atorvastatin 20 MG tablet Commonly known as: LIPITOR Take 20 mg by mouth daily.   Benfotiamine 150 MG Caps Take 150 mg by mouth daily.   lisinopril 20 MG tablet Commonly known as: ZESTRIL Take 20 mg by mouth daily.   metoprolol tartrate 50 MG tablet Commonly known as: LOPRESSOR Take 50 mg by mouth 2 (two) times daily.   multivitamin with minerals Tabs tablet Take 1 tablet by mouth daily.   Vitamin D 50 MCG (2000 UT) tablet Take 2,000 Units by mouth daily.  zinc gluconate 50 MG tablet Take 50 mg by mouth 3 (three) times a week.         ROS:  Constitutional: negative for chills, fatigue, and fevers Eyes: negative for visual disturbance and pain Ears, nose, mouth, throat, and face: negative for ear drainage, sore throat, and sinus problems Respiratory: negative for cough, wheezing, and shortness of breath Cardiovascular: negative for chest pain and palpitations Gastrointestinal: negative for abdominal pain, nausea, reflux symptoms, and vomiting Genitourinary:negative for dysuria and frequency Integument/breast: negative for dryness and rash Hematologic/lymphatic: negative for bleeding and lymphadenopathy Musculoskeletal:negative for back pain and neck pain Neurological: negative for  dizziness and tremors Endocrine: negative for temperature intolerance  Blood pressure (!) 149/76, pulse (!) 54, temperature 98.3 F (36.8 C), temperature source Oral, resp. rate 12, height 5\' 8"  (1.727 m), weight 178 lb (80.7 kg), SpO2 98 %. Physical Exam Vitals reviewed.  Constitutional:      Appearance: Normal appearance.  HENT:     Head: Normocephalic and atraumatic.  Eyes:     Extraocular Movements: Extraocular movements intact.     Pupils: Pupils are equal, round, and reactive to light.  Cardiovascular:     Rate and Rhythm: Normal rate and regular rhythm.  Pulmonary:     Effort: Pulmonary effort is normal.     Breath sounds: Normal breath sounds.  Abdominal:     General: There is no distension.     Palpations: Abdomen is soft.     Tenderness: There is no abdominal tenderness.  Musculoskeletal:        General: Normal range of motion.     Cervical back: Normal range of motion.  Skin:    General: Skin is warm and dry.     Comments: 2 cm back cyst on right flank, nontender to palpation, no active drainage, no erythema or induration  Neurological:     General: No focal deficit present.     Mental Status: She is alert and oriented to person, place, and time.  Psychiatric:        Mood and Affect: Mood normal.        Behavior: Behavior normal.    Results: No results found for this or any previous visit (from the past 48 hour(s)).  No results found.   Assessment & Plan:  Kathleen Walker is a 72 y.o. female who presents for evaluation of the right posterior back cyst.  -I explained the pathophysiology of cysts, I would recommend surgical excision if they are causing pain for has been infected in the past. -The risk and benefits of right back cyst excision were discussed including but not limited to bleeding, infection, injury to surrounding structures, and need for additional procedures.  After careful consideration, Shreenika Strelow has decided to proceed with surgery. -Patient  tentatively scheduled for surgery on 4/17 -Information provided to the patient regarding sebaceous cysts -Advised her to call or follow-up if the area becomes red, painful, or starts draining pus  All questions were answered to the satisfaction of the patient.  Theophilus Kinds, DO [facility] [address] [place], [place] [address] [phone] (office)

## 2023-02-05 NOTE — Patient Instructions (Signed)
Kathleen Walker  02/05/2023     @PREFPERIOPPHARMACY @   Your procedure is scheduled on  02/10/2023.   Report to [facility] at  1030  A.M.   Call this number if you have problems the morning of surgery:  [phone]  If you experience any cold or flu symptoms such as cough, fever, chills, shortness of breath, etc. between now and your scheduled surgery, please notify us at the above number.   Remember:  Do not eat or drink after midnight.      Take these medicines the morning of surgery with A SIP OF WATER                                      metoprolol.    Do not wear jewelry, make-up or nail polish.  Do not wear lotions, powders, or perfumes, or deodorant.  Do not shave 48 hours prior to surgery.  Men may shave face and neck.  Do not bring valuables to the hospital.  [facility] is not responsible for any belongings or valuables.  Contacts, dentures or bridgework may not be worn into surgery.  Leave your suitcase in the car.  After surgery it may be brought to your room.  For patients admitted to the hospital, discharge time will be determined by your treatment team.  Patients discharged the day of surgery will not be allowed to drive home and must have someone with them for 24 hours.    Special instructions:   DO NOT smoke tobacco or vape for 24 hours before your procedure.  Please read over the following fact sheets that you were given. Coughing and Deep Breathing, Surgical Site Infection Prevention, Anesthesia Post-op Instructions, and Care and Recovery After Surgery      Incision Care, Adult An incision is a cut that a doctor makes in your skin for surgery. Most times, these cuts are closed after surgery. Your cut from surgery may be closed with: Stitches (sutures). Staples. Skin glue. Skin tape (adhesive) strips. You may need to go back to your doctor to have stitches or staples taken out. This may happen many days or many weeks after your surgery. You  need to take good care of your cut so it does not get infected. Follow instructions from your doctor about how to care for your cut. Supplies needed: Soap and water. A clean hand towel. Wound cleanser. A clean bandage (dressing), if needed. Cream or ointment, if told by your doctor. Clean gauze. How to care for your cut from surgery Cleaning your cut Ask your doctor how to clean your cut. You may need to: Wear medical gloves. Use mild soap and water, or a wound cleanser. Use a clean gauze to pat your cut dry after you clean it. Changing your bandage Wash your hands with soap and water for at least 20 seconds before and after you change your bandage. If you cannot use soap and water, use hand sanitizer. Do not usedisinfectants or antiseptics, such as rubbing alcohol, to clean your wound unless told by your doctor. Change your bandage as told by your doctor. Leavestitches or skin glue in place for at least 2 weeks. Leave tape strips alone unless you are told to take them off. You may trim the edges of the tape strips if they curl up. Put a cream or ointment on your cut. Do this  only as told. Cover your cut with a clean bandage. Ask your doctor when you can leave your cut uncovered. Checking for infection Check your cut area every day for signs of infection. Check for: More redness, swelling, or pain. More fluid or blood. New warmth. Hardness or a new rash around the incision. Pus or a bad smell.  Follow these instructions at home Medicines Take over-the-counter and prescription medicines only as told by your doctor. If you were prescribed an antibiotic medicine, cream, or ointment, use it as told by your doctor. Do not stop using the antibiotic even if you start to feel better. Eating and drinking Eat foods that have a lot of certain nutrients, such as protein, vitamin A, and vitamin C. These foods help your cut heal. Foods rich in protein include meat, fish, eggs, dairy, beans,  nuts, and protein drinks. Foods rich in vitamin A include carrots and dark green, leafy vegetables. Foods rich in vitamin C include citrus fruits, tomatoes, broccoli, and peppers. Drink enough fluid to keep your pee (urine) pale yellow. General instructions  Do not take baths, swim, or use a hot tub. Ask your doctor about taking showers or sponge baths. Limit movement around your cut. This helps with healing. Try not to strain, lift, or exercise for the first 2 weeks, or for as long as told by your doctor. Return to your normal activities as told by your doctor. Ask your doctor what activities are safe for you. Do not scratch, scrub, or pick at your cut. Keep it covered as told by your doctor. Protect your cut from the sun when you are outside for the first 6 months, or for as long as told by your doctor. Cover up the scar area or put on sunscreen that has an SPF of at least 30. Do not use any products that contain nicotine or tobacco, such as cigarettes, e-cigarettes, and chewing tobacco. These can delay cut healing. If you need help quitting, ask your doctor. Keep all follow-up visits. Contact a doctor if: You have any of these signs of infection around your cut: More redness, swelling, or pain. More fluid or blood. New warmth or hardness. Pus or a bad smell. A new rash. You have a fever. You feel like you may vomit (nauseous). You vomit. You are dizzy. Your stitches, staples, skin glue, or tape strips come undone. Your cut gets bigger. You have a fever. Get help right away if: Your cut bleeds through your bandage, and bleeding does not stop with gentle pressure. Your cut opens up and comes apart. These symptoms may be an emergency. Do not wait to see if the symptoms will go away. Get medical help right away. Call your local emergency services (911 in the [place] Do not drive yourself to the hospital. Summary Follow instructions from your doctor about how to care for your cut. Wash  your hands with soap and water for at least 20 seconds before and after you change your bandage. If you cannot use soap and water, use hand sanitizer. Check your cut area every day for signs of infection. Keep all follow-up visits. This information is not intended to replace advice given to you by your health care provider. Make sure you discuss any questions you have with your health care provider. Document Revised: 01/13/2021 Document Reviewed: 01/13/2021 Elsevier Patient Education  2023 [employer] Monitored Anesthesia Care, Care After The following information offers guidance on how to care for yourself after your procedure. Your health care  provider may also give you more specific instructions. If you have problems or questions, contact your health care provider. What can I expect after the procedure? After the procedure, it is common to have: Tiredness. Little or no memory about what happened during or after the procedure. Impaired judgment when it comes to making decisions. Nausea or vomiting. Some trouble with balance. Follow these instructions at home: For the time period you were told by your health care provider:  Rest. Do not participate in activities where you could fall or become injured. Do not drive or use machinery. Do not drink alcohol. Do not take sleeping pills or medicines that cause drowsiness. Do not make important decisions or sign legal documents. Do not take care of children on your own. Medicines Take over-the-counter and prescription medicines only as told by your health care provider. If you were prescribed antibiotics, take them as told by your health care provider. Do not stop using the antibiotic even if you start to feel better. Eating and drinking Follow instructions from your health care provider about what you may eat and drink. Drink enough fluid to keep your urine pale yellow. If you vomit: Drink clear fluids slowly and in small amounts as you  are able. Clear fluids include water, ice chips, low-calorie sports drinks, and fruit juice that has water added to it (diluted fruit juice). Eat light and bland foods in small amounts as you are able. These foods include bananas, applesauce, rice, lean meats, toast, and crackers. General instructions  Have a responsible adult stay with you for the time you are told. It is important to have someone help care for you until you are awake and alert. If you have sleep apnea, surgery and some medicines can increase your risk for breathing problems. Follow instructions from your health care provider about wearing your sleep device: When you are sleeping. This includes during daytime naps. While taking prescription pain medicines, sleeping medicines, or medicines that make you drowsy. Do not use any products that contain nicotine or tobacco. These products include cigarettes, chewing tobacco, and vaping devices, such as e-cigarettes. If you need help quitting, ask your health care provider. Contact a health care provider if: You feel nauseous or vomit every time you eat or drink. You feel light-headed. You are still sleepy or having trouble with balance after 24 hours. You get a rash. You have a fever. You have redness or swelling around the IV site. Get help right away if: You have trouble breathing. You have new confusion after you get home. These symptoms may be an emergency. Get help right away. Call 911. Do not wait to see if the symptoms will go away. Do not drive yourself to the hospital. This information is not intended to replace advice given to you by your health care provider. Make sure you discuss any questions you have with your health care provider. Document Revised: 03/09/2022 Document Reviewed: 03/09/2022 Elsevier Patient Education  2023 Else[employer] Inc. How to Use Chlorhexidine Before Surgery Chlorhexidine gluconate (CHG) is a germ-killing (antiseptic) solution that is used to clean  the skin. It can get rid of the bacteria that normally live on the skin and can keep them away for about 24 hours. To clean your skin with CHG, you may be given: A CHG solution to use in the shower or as part of a sponge bath. A prepackaged cloth that contains CHG. Cleaning your skin with CHG may help lower the risk for infection: While you are  staying in the intensive care unit of the hospital. If you have a vascular access, such as a central line, to provide short-term or long-term access to your veins. If you have a catheter to drain urine from your bladder. If you are on a ventilator. A ventilator is a machine that helps you breathe by moving air in and out of your lungs. After surgery. What are the risks? Risks of using CHG include: A skin reaction. Hearing loss, if CHG gets in your ears and you have a perforated eardrum. Eye injury, if CHG gets in your eyes and is not rinsed out. The CHG product catching fire. Make sure that you avoid smoking and flames after applying CHG to your skin. Do not use CHG: If you have a chlorhexidine allergy or have previously reacted to chlorhexidine. On babies younger than 22 months of age. How to use CHG solution Use CHG only as told by your health care provider, and follow the instructions on the label. Use the full amount of CHG as directed. Usually, this is one bottle. During a shower Follow these steps when using CHG solution during a shower (unless your health care provider gives you different instructions): Start the shower. Use your normal soap and shampoo to wash your face and hair. Turn off the shower or move out of the shower stream. Pour the CHG onto a clean washcloth. Do not use any type of brush or rough-edged sponge. Starting at your neck, lather your body down to your toes. Make sure you follow these instructions: If you will be having surgery, pay special attention to the part of your body where you will be having surgery. Scrub this  area for at least 1 minute. Do not use CHG on your head or face. If the solution gets into your ears or eyes, rinse them well with water. Avoid your genital area. Avoid any areas of skin that have broken skin, cuts, or scrapes. Scrub your back and under your arms. Make sure to wash skin folds. Let the lather sit on your skin for 1-2 minutes or as long as told by your health care provider. Thoroughly rinse your entire body in the shower. Make sure that all body creases and crevices are rinsed well. Dry off with a clean towel. Do not put any substances on your body afterward--such as powder, lotion, or perfume--unless you are told to do so by your health care provider. Only use lotions that are recommended by the manufacturer. Put on clean clothes or pajamas. If it is the night before your surgery, sleep in clean sheets.  During a sponge bath Follow these steps when using CHG solution during a sponge bath (unless your health care provider gives you different instructions): Use your normal soap and shampoo to wash your face and hair. Pour the CHG onto a clean washcloth. Starting at your neck, lather your body down to your toes. Make sure you follow these instructions: If you will be having surgery, pay special attention to the part of your body where you will be having surgery. Scrub this area for at least 1 minute. Do not use CHG on your head or face. If the solution gets into your ears or eyes, rinse them well with water. Avoid your genital area. Avoid any areas of skin that have broken skin, cuts, or scrapes. Scrub your back and under your arms. Make sure to wash skin folds. Let the lather sit on your skin for 1-2 minutes or as long as  told by your health care provider. Using a different clean, wet washcloth, thoroughly rinse your entire body. Make sure that all body creases and crevices are rinsed well. Dry off with a clean towel. Do not put any substances on your body afterward--such as  powder, lotion, or perfume--unless you are told to do so by your health care provider. Only use lotions that are recommended by the manufacturer. Put on clean clothes or pajamas. If it is the night before your surgery, sleep in clean sheets. How to use CHG prepackaged cloths Only use CHG cloths as told by your health care provider, and follow the instructions on the label. Use the CHG cloth on clean, dry skin. Do not use the CHG cloth on your head or face unless your health care provider tells you to. When washing with the CHG cloth: Avoid your genital area. Avoid any areas of skin that have broken skin, cuts, or scrapes. Before surgery Follow these steps when using a CHG cloth to clean before surgery (unless your health care provider gives you different instructions): Using the CHG cloth, vigorously scrub the part of your body where you will be having surgery. Scrub using a back-and-forth motion for 3 minutes. The area on your body should be completely wet with CHG when you are done scrubbing. Do not rinse. Discard the cloth and let the area air-dry. Do not put any substances on the area afterward, such as powder, lotion, or perfume. Put on clean clothes or pajamas. If it is the night before your surgery, sleep in clean sheets.  For general bathing Follow these steps when using CHG cloths for general bathing (unless your health care provider gives you different instructions). Use a separate CHG cloth for each area of your body. Make sure you wash between any folds of skin and between your fingers and toes. Wash your body in the following order, switching to a new cloth after each step: The front of your neck, shoulders, and chest. Both of your arms, under your arms, and your hands. Your stomach and groin area, avoiding the genitals. Your right leg and foot. Your left leg and foot. The back of your neck, your back, and your buttocks. Do not rinse. Discard the cloth and let the area air-dry.  Do not put any substances on your body afterward--such as powder, lotion, or perfume--unless you are told to do so by your health care provider. Only use lotions that are recommended by the manufacturer. Put on clean clothes or pajamas. Contact a health care provider if: Your skin gets irritated after scrubbing. You have questions about using your solution or cloth. You swallow any chlorhexidine. Call your local poison control center ([phone] in the [place] Get help right away if: Your eyes itch badly, or they become very red or swollen. Your skin itches badly and is red or swollen. Your hearing changes. You have trouble seeing. You have swelling or tingling in your mouth or throat. You have trouble breathing. These symptoms may represent a serious problem that is an emergency. Do not wait to see if the symptoms will go away. Get medical help right away. Call your local emergency services (911 in the [place] Do not drive yourself to the hospital. Summary Chlorhexidine gluconate (CHG) is a germ-killing (antiseptic) solution that is used to clean the skin. Cleaning your skin with CHG may help to lower your risk for infection. You may be given CHG to use for bathing. It may be in a bottle or in  a prepackaged cloth to use on your skin. Carefully follow your health care provider's instructions and the instructions on the product label. Do not use CHG if you have a chlorhexidine allergy. Contact your health care provider if your skin gets irritated after scrubbing. This information is not intended to replace advice given to you by your health care provider. Make sure you discuss any questions you have with your health care provider. Document Revised: 02/09/2022 Document Reviewed: 12/23/2020 Elsevier Patient Education  2023 [employer].

## 2023-02-08 ENCOUNTER — Encounter (HOSPITAL_COMMUNITY): Payer: Self-pay

## 2023-02-08 ENCOUNTER — Encounter (HOSPITAL_COMMUNITY)
Admission: RE | Admit: 2023-02-08 | Discharge: 2023-02-08 | Disposition: A | Discharge status: Home / Self Care | Payer: Medicare Other | Source: Ambulatory Visit | Attending: Surgery | Admitting: Surgery

## 2023-02-08 VITALS — BP 149/76 | HR 55 | Temp 98.2°F | Resp 16 | Ht 68.0 in | Wt 178.0 lb

## 2023-02-08 DIAGNOSIS — Z01810 Encounter for preprocedural cardiovascular examination: Secondary | ICD-10-CM | POA: Diagnosis present

## 2023-02-08 DIAGNOSIS — I10 Essential (primary) hypertension: Secondary | ICD-10-CM

## 2023-02-08 DIAGNOSIS — I444 Left anterior fascicular block: Secondary | ICD-10-CM | POA: Insufficient documentation

## 2023-02-10 ENCOUNTER — Encounter (HOSPITAL_COMMUNITY)
Admission: RE | Disposition: A | Discharge status: Home / Self Care | Payer: Self-pay | Source: Home / Self Care | Attending: Surgery

## 2023-02-10 ENCOUNTER — Ambulatory Visit (HOSPITAL_COMMUNITY): Payer: Medicare Other | Admitting: Anesthesiology

## 2023-02-10 ENCOUNTER — Encounter (HOSPITAL_COMMUNITY): Payer: Self-pay | Admitting: Surgery

## 2023-02-10 ENCOUNTER — Other Ambulatory Visit: Payer: Self-pay

## 2023-02-10 ENCOUNTER — Ambulatory Visit (HOSPITAL_BASED_OUTPATIENT_CLINIC_OR_DEPARTMENT_OTHER): Payer: Medicare Other | Admitting: Anesthesiology

## 2023-02-10 ENCOUNTER — Ambulatory Visit (HOSPITAL_COMMUNITY)
Admission: RE | Admit: 2023-02-10 | Discharge: 2023-02-10 | Disposition: A | Discharge status: Home / Self Care | Payer: Medicare Other | Attending: Surgery | Admitting: Surgery

## 2023-02-10 DIAGNOSIS — R222 Localized swelling, mass and lump, trunk: Secondary | ICD-10-CM

## 2023-02-10 DIAGNOSIS — K219 Gastro-esophageal reflux disease without esophagitis: Secondary | ICD-10-CM | POA: Diagnosis not present

## 2023-02-10 DIAGNOSIS — I10 Essential (primary) hypertension: Secondary | ICD-10-CM | POA: Insufficient documentation

## 2023-02-10 DIAGNOSIS — L720 Epidermal cyst: Secondary | ICD-10-CM | POA: Insufficient documentation

## 2023-02-10 HISTORY — PX: MASS EXCISION: SHX2000

## 2023-02-10 LAB — GLUCOSE, CAPILLARY: Glucose-Capillary: 93 mg/dL (ref 70–99)

## 2023-02-10 SURGERY — EXCISION MASS
Anesthesia: General | Site: Back | Laterality: Right

## 2023-02-10 MED ORDER — EPHEDRINE SULFATE-NACL 50-0.9 MG/10ML-% IV SOSY
PREFILLED_SYRINGE | INTRAVENOUS | Status: DC | PRN
  Administered 2023-02-10 (×2): 5 mg via INTRAVENOUS

## 2023-02-10 MED ORDER — DOCUSATE SODIUM 100 MG PO CAPS: 100.0000 mg | ORAL_CAPSULE | Freq: Two times a day (BID) | ORAL | 2 refills | Status: AC

## 2023-02-10 MED ORDER — ONDANSETRON HCL 4 MG/2ML IJ SOLN
INTRAMUSCULAR | Status: DC | PRN
  Administered 2023-02-10: 4 mg via INTRAVENOUS

## 2023-02-10 MED ORDER — SEVOFLURANE IN SOLN
RESPIRATORY_TRACT | Status: AC
  Filled 2023-02-10: qty 250

## 2023-02-10 MED ORDER — CHLORHEXIDINE GLUCONATE CLOTH 2 % EX PADS: 6.0000 | MEDICATED_PAD | Freq: Once | CUTANEOUS | Status: DC

## 2023-02-10 MED ORDER — DEXAMETHASONE SODIUM PHOSPHATE 4 MG/ML IJ SOLN
INTRAMUSCULAR | Status: DC | PRN
  Administered 2023-02-10: 5 mg via INTRAVENOUS

## 2023-02-10 MED ORDER — LIDOCAINE 2% (20 MG/ML) 5 ML SYRINGE
INTRAMUSCULAR | Status: DC | PRN
  Administered 2023-02-10: 50 mg via INTRAVENOUS

## 2023-02-10 MED ORDER — BACITRACIN 500 UNIT/GM EX OINT
TOPICAL_OINTMENT | CUTANEOUS | Status: DC | PRN
  Administered 2023-02-10: 1 via TOPICAL

## 2023-02-10 MED ORDER — FENTANYL CITRATE (PF) 100 MCG/2ML IJ SOLN
INTRAMUSCULAR | Status: AC
  Filled 2023-02-10: qty 2

## 2023-02-10 MED ORDER — MIDAZOLAM HCL 5 MG/5ML IJ SOLN
INTRAMUSCULAR | Status: DC | PRN
  Administered 2023-02-10: 2 mg via INTRAVENOUS

## 2023-02-10 MED ORDER — KETOROLAC TROMETHAMINE 30 MG/ML IJ SOLN
INTRAMUSCULAR | Status: AC
  Filled 2023-02-10: qty 1

## 2023-02-10 MED ORDER — ONDANSETRON HCL 4 MG/2ML IJ SOLN: 4.0000 mg | Freq: Once | INTRAMUSCULAR | Status: DC | PRN

## 2023-02-10 MED ORDER — CEFAZOLIN SODIUM-DEXTROSE 2-4 GM/100ML-% IV SOLN
2.0000 g | INTRAVENOUS | Status: AC
  Administered 2023-02-10: 2 g via INTRAVENOUS
  Filled 2023-02-10: qty 100

## 2023-02-10 MED ORDER — DEXAMETHASONE SODIUM PHOSPHATE 10 MG/ML IJ SOLN
INTRAMUSCULAR | Status: AC
  Filled 2023-02-10: qty 1

## 2023-02-10 MED ORDER — OXYCODONE HCL 5 MG PO TABS: 5.0000 mg | ORAL_TABLET | Freq: Four times a day (QID) | ORAL | 0 refills | Status: DC | PRN

## 2023-02-10 MED ORDER — BACITRACIN ZINC 500 UNIT/GM EX OINT
TOPICAL_OINTMENT | CUTANEOUS | Status: AC
  Filled 2023-02-10: qty 0.9

## 2023-02-10 MED ORDER — GLYCOPYRROLATE PF 0.2 MG/ML IJ SOSY
PREFILLED_SYRINGE | INTRAMUSCULAR | Status: AC
  Filled 2023-02-10: qty 1

## 2023-02-10 MED ORDER — EPHEDRINE 5 MG/ML INJ
INTRAVENOUS | Status: AC
  Filled 2023-02-10: qty 5

## 2023-02-10 MED ORDER — KETOROLAC TROMETHAMINE 30 MG/ML IJ SOLN
INTRAMUSCULAR | Status: DC | PRN
  Administered 2023-02-10: 30 mg via INTRAVENOUS

## 2023-02-10 MED ORDER — ACETAMINOPHEN 325 MG PO TABS: 650.0000 mg | ORAL_TABLET | Freq: Four times a day (QID) | ORAL | 0 refills | Status: AC

## 2023-02-10 MED ORDER — PHENYLEPHRINE 80 MCG/ML (10ML) SYRINGE FOR IV PUSH (FOR BLOOD PRESSURE SUPPORT)
PREFILLED_SYRINGE | INTRAVENOUS | Status: DC | PRN
  Administered 2023-02-10 (×2): 160 ug via INTRAVENOUS

## 2023-02-10 MED ORDER — FENTANYL CITRATE PF 50 MCG/ML IJ SOSY: 25.0000 ug | PREFILLED_SYRINGE | INTRAMUSCULAR | Status: DC | PRN

## 2023-02-10 MED ORDER — PROPOFOL 500 MG/50ML IV EMUL
INTRAVENOUS | Status: AC
  Filled 2023-02-10: qty 50

## 2023-02-10 MED ORDER — PROPOFOL 500 MG/50ML IV EMUL
INTRAVENOUS | Status: DC | PRN
  Administered 2023-02-10: 60 ug/kg/min via INTRAVENOUS

## 2023-02-10 MED ORDER — ONDANSETRON HCL 4 MG/2ML IJ SOLN
INTRAMUSCULAR | Status: AC
  Filled 2023-02-10: qty 2

## 2023-02-10 MED ORDER — LIDOCAINE HCL (PF) 1 % IJ SOLN
INTRAMUSCULAR | Status: DC | PRN
  Administered 2023-02-10: 17 mL

## 2023-02-10 MED ORDER — PROPOFOL 10 MG/ML IV BOLUS
INTRAVENOUS | Status: AC
  Filled 2023-02-10: qty 20

## 2023-02-10 MED ORDER — 0.9 % SODIUM CHLORIDE (POUR BTL) OPTIME
TOPICAL | Status: DC | PRN
  Administered 2023-02-10: 1000 mL

## 2023-02-10 MED ORDER — LIDOCAINE HCL (PF) 1 % IJ SOLN
INTRAMUSCULAR | Status: AC
  Filled 2023-02-10: qty 30

## 2023-02-10 MED ORDER — PROPOFOL 10 MG/ML IV BOLUS
INTRAVENOUS | Status: DC | PRN
  Administered 2023-02-10 (×2): 30 mg via INTRAVENOUS

## 2023-02-10 MED ORDER — LACTATED RINGERS IV SOLN: INTRAVENOUS | Status: DC

## 2023-02-10 MED ORDER — MIDAZOLAM HCL 2 MG/2ML IJ SOLN
INTRAMUSCULAR | Status: AC
  Filled 2023-02-10: qty 2

## 2023-02-10 MED ORDER — GLYCOPYRROLATE PF 0.2 MG/ML IJ SOSY
PREFILLED_SYRINGE | INTRAMUSCULAR | Status: DC | PRN
  Administered 2023-02-10: .2 mg via INTRAVENOUS

## 2023-02-10 MED ORDER — FENTANYL CITRATE (PF) 100 MCG/2ML IJ SOLN
INTRAMUSCULAR | Status: DC | PRN
  Administered 2023-02-10 (×2): 25 ug via INTRAVENOUS

## 2023-02-10 MED ORDER — ORAL CARE MOUTH RINSE: 15.0000 mL | Freq: Once | OROMUCOSAL | Status: AC

## 2023-02-10 MED ORDER — DEXMEDETOMIDINE HCL IN NACL 80 MCG/20ML IV SOLN
INTRAVENOUS | Status: AC
  Filled 2023-02-10: qty 20

## 2023-02-10 MED ORDER — LIDOCAINE HCL (PF) 2 % IJ SOLN
INTRAMUSCULAR | Status: AC
  Filled 2023-02-10: qty 5

## 2023-02-10 MED ORDER — CHLORHEXIDINE GLUCONATE 0.12 % MT SOLN
15.0000 mL | Freq: Once | OROMUCOSAL | Status: AC
  Administered 2023-02-10: 15 mL via OROMUCOSAL
  Filled 2023-02-10: qty 15

## 2023-02-10 MED ORDER — BUPIVACAINE HCL (PF) 0.5 % IJ SOLN
INTRAMUSCULAR | Status: AC
  Filled 2023-02-10: qty 30

## 2023-02-10 MED ORDER — PHENYLEPHRINE 80 MCG/ML (10ML) SYRINGE FOR IV PUSH (FOR BLOOD PRESSURE SUPPORT)
PREFILLED_SYRINGE | INTRAVENOUS | Status: AC
  Filled 2023-02-10: qty 10

## 2023-02-10 SURGICAL SUPPLY — 32 items
ADH SKN CLS APL DERMABOND .7 (GAUZE/BANDAGES/DRESSINGS)
APL PRP STRL LF ISPRP CHG 10.5 (MISCELLANEOUS) ×1
APPLICATOR CHLORAPREP 10.5 ORG (MISCELLANEOUS) ×1 IMPLANT
CLOTH BEACON ORANGE TIMEOUT ST (SAFETY) ×1 IMPLANT
COVER LIGHT HANDLE STERIS (MISCELLANEOUS) ×2 IMPLANT
DECANTER SPIKE VIAL GLASS SM (MISCELLANEOUS) ×1 IMPLANT
DERMABOND ADVANCED .7 DNX12 (GAUZE/BANDAGES/DRESSINGS) IMPLANT
DRAPE EENT ADH APERT 31X51 STR (DRAPES) IMPLANT
ELECT NDL TIP 2.8 STRL (NEEDLE) IMPLANT
ELECT NEEDLE TIP 2.8 STRL (NEEDLE) IMPLANT
ELECT REM PT RETURN 9FT ADLT (ELECTROSURGICAL) ×1
ELECTRODE REM PT RTRN 9FT ADLT (ELECTROSURGICAL) ×1 IMPLANT
GAUZE SPONGE 2X2 STRL 8-PLY (GAUZE/BANDAGES/DRESSINGS) IMPLANT
GLOVE BIOGEL PI IND STRL 6.5 (GLOVE) ×1 IMPLANT
GLOVE BIOGEL PI IND STRL 7.0 (GLOVE) ×2 IMPLANT
GLOVE SURG SS PI 6.5 STRL IVOR (GLOVE) ×2 IMPLANT
GOWN STRL REUS W/TWL LRG LVL3 (GOWN DISPOSABLE) ×2 IMPLANT
KIT TURNOVER KIT A (KITS) ×1 IMPLANT
MANIFOLD NEPTUNE II (INSTRUMENTS) ×1 IMPLANT
NDL HYPO 25X1 1.5 SAFETY (NEEDLE) ×1 IMPLANT
NEEDLE HYPO 25X1 1.5 SAFETY (NEEDLE) ×1 IMPLANT
NS IRRIG 1000ML POUR BTL (IV SOLUTION) ×1 IMPLANT
PACK MINOR (CUSTOM PROCEDURE TRAY) IMPLANT
PAD ARMBOARD 7.5X6 YLW CONV (MISCELLANEOUS) ×1 IMPLANT
PAD TELFA 3X4 1S STER (GAUZE/BANDAGES/DRESSINGS) IMPLANT
SET BASIN LINEN APH (SET/KITS/TRAYS/PACK) ×1 IMPLANT
SUT ETHILON 3 0 FSL (SUTURE) IMPLANT
SUT MNCRL AB 4-0 PS2 18 (SUTURE) ×1 IMPLANT
SUT VIC AB 3-0 SH 27 (SUTURE) ×1
SUT VIC AB 3-0 SH 27X BRD (SUTURE) ×1 IMPLANT
SYR CONTROL 10ML LL (SYRINGE) ×1 IMPLANT
TAPE PAPER 2X10 WHT MICROPORE (GAUZE/BANDAGES/DRESSINGS) IMPLANT

## 2023-02-10 NOTE — Anesthesia Preprocedure Evaluation (Signed)
Anesthesia Evaluation  Patient identified by MRN, date of birth, ID band Patient awake    Reviewed: Allergy & Precautions, H&P , NPO status , Patient's Chart, lab work & pertinent test results, reviewed documented beta blocker date and time   History of Anesthesia Complications (+) DIFFICULT AIRWAY and history of anesthetic complications (had to use smaller ETT to intubate)  Airway Mallampati: III  TM Distance: >3 FB Neck ROM: Full  Mouth opening: Limited Mouth Opening  Dental  (+) Dental Advisory Given, Teeth Intact   Pulmonary neg pulmonary ROS   Pulmonary exam normal breath sounds clear to auscultation       Cardiovascular hypertension, Pt. on medications and Pt. on home beta blockers Normal cardiovascular exam Rhythm:Regular Rate:Normal     Neuro/Psych negative neurological ROS  negative psych ROS   GI/Hepatic Neg liver ROS,GERD  ,,  Endo/Other  negative endocrine ROS    Renal/GU negative Renal ROS  negative genitourinary   Musculoskeletal negative musculoskeletal ROS (+)    Abdominal   Peds negative pediatric ROS (+)  Hematology negative hematology ROS (+)   Anesthesia Other Findings   Reproductive/Obstetrics negative OB ROS                             Anesthesia Physical Anesthesia Plan  ASA: 2  Anesthesia Plan: General   Post-op Pain Management: Dilaudid IV and Minimal or no pain anticipated   Induction: Intravenous  PONV Risk Score and Plan: 3 and Ondansetron  Airway Management Planned: Natural Airway, Nasal Cannula, LMA and Simple Face Mask  Additional Equipment:   Intra-op Plan:   Post-operative Plan:   Informed Consent: I have reviewed the patients History and Physical, chart, labs and discussed the procedure including the risks, benefits and alternatives for the proposed anesthesia with the patient or authorized representative who has indicated his/her  understanding and acceptance.     Dental advisory given  Plan Discussed with: CRNA and Surgeon  Anesthesia Plan Comments:         Anesthesia Quick Evaluation

## 2023-02-10 NOTE — Anesthesia Procedure Notes (Signed)
Date/Time: 02/10/2023 7:56 AM  Performed by: Julian Reil, CRNAPre-anesthesia Checklist: Patient identified, Emergency Drugs available, Suction available and Patient being monitored Patient Re-evaluated:Patient Re-evaluated prior to induction Oxygen Delivery Method: Simple face mask Induction Type: IV induction Placement Confirmation: positive ETCO2

## 2023-02-10 NOTE — Anesthesia Postprocedure Evaluation (Signed)
Anesthesia Post Note  Patient: Kathleen Walker  Procedure(s) Performed: EXCISION MASS, BACK FLANK (Right)  Patient location during evaluation: Phase II Anesthesia Type: General Level of consciousness: awake and alert and oriented Pain management: pain level controlled Vital Signs Assessment: post-procedure vital signs reviewed and stable Respiratory status: spontaneous breathing, nonlabored ventilation and respiratory function stable Cardiovascular status: blood pressure returned to baseline and stable Postop Assessment: no apparent nausea or vomiting Anesthetic complications: no  No notable events documented.   Last Vitals:  Vitals:   02/10/23 0818 02/10/23 0830  BP: (!) 94/57 105/62  Pulse: 68 66  Resp: 11 14  Temp: (!) 36.3 C   SpO2: 97% 94%    Last Pain:  Vitals:   02/10/23 0852  TempSrc:   PainSc: 0-No pain                 Loula Marcella C Rogerio Boutelle

## 2023-02-10 NOTE — Op Note (Signed)
[facility] Operative Note  02/10/23  Preoperative Diagnosis: Right flank cyst   Postoperative Diagnosis: Same   Procedure(s) Performed: Excision of right flank cyst   Surgeon: Theophilus Kinds, DO    Assistants: No qualified resident was available    Anesthesia: MAC   Anesthesiologist: Molli Barrows, MD    Specimens: right flank mass   Estimated Blood Loss: Minimal   Blood Replacement: None    Complications: None   Wound Class: Clean   Operative Indications: Patient is a 72 year old female who presents for right flank mass excision.  The area has gotten inflamed recently, and she would like it removed.  She is agreeable to surgery.  All risks and benefits of performing this procedure were discussed with the patient including pain, infection, bleeding, damage to the surrounding structures, and need for more procedures or surgery. The patient voiced understanding of the procedure, all questions were sought and answered, and consent was obtained.  Findings: 1.5 cm right flank mass, likely cyst   Procedure: The patient was taken to the operating room and placed supine. MAC anesthesia was induced. Intravenous antibiotics were administered per protocol.  The patient was then positioned in a left lateral decubitus position.  The right flank was prepared and draped in the usual sterile fashion.  A time-out was completed verifying correct patient, procedure, site, positioning, and implant(s) and/or special equipment prior to beginning this procedure.  Marcaine was instilled around the planned incision site. An elliptical incision was made overlying the mass. Using electrocautery, the dermis and subcutaneous tissues dissected around the mass. The mass was removed in its entirety and sent to pathology for evaluation.  The cavity was evaluated, and a small area of cyst wall was also removed.  The cavity was irrigated with saline.  Hemostasis was achieved using  electrocautery. The dermis was approximated using 3-0 Vicryl sutures. The skin was closed using 3-0 Nylon in a vertical mattress fashion. The incision was dressed with Bacitracin, Telfa, 2 x 2, and Medipore tape.  Final inspection revealed acceptable hemostasis. All counts were correct at the end of the case. The patient was awakened from anesthesia without complication.  The patient went to the PACU in stable condition.   Theophilus Kinds, DO  [facility] [address]ella Raring [place], [place] [address] [phone] (office)

## 2023-02-10 NOTE — Discharge Instructions (Signed)
Ambulatory Surgery Discharge Instructions  General Anesthesia or Sedation Do not drive or operate heavy machinery for 24 hours.  Do not consume alcohol, tranquilizers, sleeping medications, or any non-prescribed medications for 24 hours. Do not make important decisions or sign any important papers in the next 24 hours. You should have someone with you tonight at home.  Activity  You are advised to go directly home from the hospital.  Restrict your activities and rest for a day.  Resume light activity tomorrow. No heavy lifting over 10 lbs or strenuous exercise.  Fluids and Diet Begin with clear liquids, bouillon, dry toast, soda crackers.  If not nauseated, you may go to a regular diet when you desire.  Greasy and spicy foods are not advised.  Medications  If you have not had a bowel movement in 24 hours, take 2 tablespoons over the counter Milk of mag.             You May resume your blood thinners tomorrow (Aspirin, coumadin, or other).  You are being discharged with prescriptions for Opioid/Narcotic Medications: There are some specific considerations for these medications that you should know. Opioid Meds have risks & benefits. Addiction to these meds is always a concern with prolonged use Take medication only as directed Do not drive while taking narcotic pain medication Do not crush tablets or capsules Do not use a different container than medication was dispensed in Lock the container of medication in a cool, dry place out of reach of children and pets. Opioid medication can cause addiction Do not share with anyone else (this is a felony) Do not store medications for future use. Dispose of them properly.     Disposal:  Find a [employer] household drug take back site near you.  If you can't get to a drug take back site, use the recipe below as a last resort to dispose of expired, unused or unwanted drugs. Disposal  (Do not dispose chemotherapy drugs this way, talk to your  prescribing doctor instead.) Step 1: Mix drugs (do not crush) with dirt, kitty litter, or used coffee grounds and add a small amount of water to dissolve any solid medications. Step 2: Seal drugs in plastic bag. Step 3: Place plastic bag in trash. Step 4: Take prescription container and scratch out personal information, then recycle or throw away.  Operative Site  You have external stitches in place.  These will be removed at your follow up appointment. Ok to English as a second language teacher. Keep wound clean and dry. No baths or swimming. No lifting more than 10 pounds.  Contact Information: If you have questions or concerns, please call our office, [phone], Monday- Thursday 8AM-5PM and Friday 8AM-12Noon.  If it is after hours or on the weekend, please call [facility], [phone], and ask to speak to the surgeon on call for Dr. Robyne Peers at [facility].   SPECIFIC COMPLICATIONS TO WATCH FOR: Inability to urinate Fever over 101? F by mouth Nausea and vomiting lasting longer than 24 hours. Pain not relieved by medication ordered Swelling around the operative site Increased redness, warmth, hardness, around operative area Numbness, tingling, or cold fingers or toes Blood -soaked dressing, (small amounts of oozing may be normal) Increasing and progressive drainage from surgical area or exam site

## 2023-02-10 NOTE — Interval H&P Note (Signed)
History and Physical Interval Note:  02/10/2023 7:26 AM  Kathleen Walker  has presented today for surgery, with the diagnosis of CYST, RIGHT FLANK 2 CM.  The various methods of treatment have been discussed with the patient and family. After consideration of risks, benefits and other options for treatment, the patient has consented to  Procedure(s) with comments: EXCISION MASS, BACK FLANK (Right) - pt knows to arrive at 6:00 as a surgical intervention.  The patient's history has been reviewed, patient examined, no change in status, stable for surgery.  I have reviewed the patient's chart and labs.  Questions were answered to the patient's satisfaction.     Kathleen Walker A Raya Mckinstry

## 2023-02-10 NOTE — Progress Notes (Signed)
[facility]  Spoke with the patient's husband in the consultation room.  I explained that she tolerated the procedure without difficulty.  She has external stitches that will be removed at her follow up appointment.  I discharged her home with a small prescription for narcotic pain medication that they should take as needed for pain.  I also want her taking scheduled Tylenol.  If they take the narcotic pain medication, they should take a stool softener as well.  The patient will follow-up with me in 2 weeks.  All questions were answered to his expressed satisfaction.  Theophilus Kinds, DO [facility] [address] [place], [place] [address] [phone] (office)

## 2023-02-10 NOTE — Transfer of Care (Signed)
Immediate Anesthesia Transfer of Care Note  Patient: Kathleen Walker  Procedure(s) Performed: EXCISION MASS, BACK FLANK (Right)  Patient Location: PACU  Anesthesia Type:General  Level of Consciousness: awake, alert , and oriented  Airway & Oxygen Therapy: Patient Spontanous Breathing  Post-op Assessment: Report given to RN and Post -op Vital signs reviewed and stable  Post vital signs: Reviewed and stable  Last Vitals:  Vitals Value Taken Time  BP    Temp    Pulse 68 02/10/23 0817  Resp 11 02/10/23 0817  SpO2 97 % 02/10/23 0817  Vitals shown include unvalidated device data.  Last Pain:  Vitals:   02/10/23 0642  TempSrc: Oral  PainSc: 0-No pain         Complications: No notable events documented.

## 2023-02-11 LAB — SURGICAL PATHOLOGY

## 2023-02-15 ENCOUNTER — Encounter (HOSPITAL_COMMUNITY): Payer: Self-pay | Admitting: Surgery

## 2023-02-24 ENCOUNTER — Ambulatory Visit (INDEPENDENT_AMBULATORY_CARE_PROVIDER_SITE_OTHER): Payer: Medicare Other | Admitting: Surgery

## 2023-02-24 ENCOUNTER — Encounter: Payer: Self-pay | Admitting: Surgery

## 2023-02-24 VITALS — BP 124/70 | HR 61 | Temp 98.6°F | Resp 16 | Ht 68.0 in | Wt 176.0 lb

## 2023-02-24 DIAGNOSIS — Z09 Encounter for follow-up examination after completed treatment for conditions other than malignant neoplasm: Secondary | ICD-10-CM

## 2023-02-24 NOTE — Progress Notes (Signed)
[facility] Note   HPI:  72 y.o. Female presents to clinic for post-op follow-up s/p right flank mass excision on 4/17.  She has been doing very well since surgery.  She has no complaints related to her surgical site, and she denies any issues or drainage from the site.  Denies fever and chills.  Review of Systems:  All other review of systems: otherwise negative   Vital Signs:  BP 124/70   Pulse 61   Temp 98.6 F (37 C) (Oral)   Resp 16   Ht 5\' 8"  (1.727 m)   Wt 176 lb (79.8 kg)   SpO2 96%   BMI 26.76 kg/m    Physical Exam:  Physical Exam Vitals reviewed.  Constitutional:      Appearance: Normal appearance.  Skin:    General: Skin is warm and dry.     Comments: Right flank incision site healing well with sutures in place  Neurological:     Mental Status: She is alert.     Laboratory studies: None   Imaging:  None  Pathology:  A. SOFT TISSUE, RIGHT BACK MASS, EXCISION:  -  Skin and subcutaneous soft tissue with chronic inflammation and a  histiocytic/granulomatous proliferation with a rare giant cell.   Note: Rare keratinaceous debris is present suggestive of a ruptured  epidermal inclusion cyst.   Assessment:  72 y.o. yo Female who presents for follow up status post right flank mass excision on 4/17  Plan:  - She has been doing very well since surgery with no complaints  - Skin sutures were removed - Advised that she should not submerge the area for a total of 4 weeks after surgery - Discussed her final pathology - Follow up as needed   All of the above recommendations were discussed with the patient, and all of patient's questions were answered to her expressed satisfaction.  Theophilus Kinds, DO [facility] [address] [place] [place], [place] [address] [phone] (office)

## 2023-04-21 ENCOUNTER — Other Ambulatory Visit (HOSPITAL_COMMUNITY): Payer: Self-pay | Admitting: Internal Medicine

## 2023-04-21 DIAGNOSIS — Z1231 Encounter for screening mammogram for malignant neoplasm of breast: Secondary | ICD-10-CM

## 2023-05-12 ENCOUNTER — Other Ambulatory Visit: Payer: Self-pay

## 2023-05-17 ENCOUNTER — Other Ambulatory Visit (HOSPITAL_COMMUNITY): Payer: Self-pay | Admitting: Internal Medicine

## 2023-05-17 DIAGNOSIS — R7989 Other specified abnormal findings of blood chemistry: Secondary | ICD-10-CM

## 2023-05-27 ENCOUNTER — Ambulatory Visit (HOSPITAL_COMMUNITY)
Admission: RE | Admit: 2023-05-27 | Discharge: 2023-05-27 | Disposition: A | Discharge status: Home / Self Care | Payer: Medicare Other | Source: Ambulatory Visit | Attending: Internal Medicine | Admitting: Internal Medicine

## 2023-05-27 DIAGNOSIS — R7989 Other specified abnormal findings of blood chemistry: Secondary | ICD-10-CM | POA: Diagnosis present

## 2023-05-30 ENCOUNTER — Telehealth: Payer: Self-pay | Admitting: Pharmacy Technician

## 2023-05-30 NOTE — Telephone Encounter (Signed)
Auth Submission: NO AUTH NEEDED Site of care: Site of care: [facility] Payer: MEDICARE A/B & BCBS SUPP Medication & CPT/J Code(s) submitted: Prolia (Denosumab) [id number] Route of submission (phone, fax, portal):  Phone # Fax # Auth type: Buy/Bill HB Units/visits requested: 1 Reference number:  Approval from: 05/30/23 to 10/26/23

## 2023-06-22 ENCOUNTER — Encounter: Payer: Medicare Other | Attending: Internal Medicine | Admitting: Emergency Medicine

## 2023-06-22 ENCOUNTER — Encounter (HOSPITAL_COMMUNITY): Admission: RE | Admit: 2023-06-22 | Payer: Medicare Other | Source: Ambulatory Visit

## 2023-06-22 VITALS — BP 127/61 | HR 57 | Temp 98.3°F | Resp 18

## 2023-06-22 DIAGNOSIS — M810 Age-related osteoporosis without current pathological fracture: Secondary | ICD-10-CM | POA: Insufficient documentation

## 2023-06-22 MED ORDER — DENOSUMAB 60 MG/ML ~~LOC~~ SOSY
60.0000 mg | PREFILLED_SYRINGE | Freq: Once | SUBCUTANEOUS | Status: AC
  Administered 2023-06-22: 60 mg via SUBCUTANEOUS

## 2023-06-22 NOTE — Progress Notes (Signed)
Diagnosis: Osteoporosis  Provider:  Dwana Melena MD  Procedure: Injection  Prolia (Denosumab), Dose: 60 mg, Site: subcutaneous, Number of injections: 1  Post Care: Patient declined observation  Discharge: Condition: Good, Destination: Home . AVS Provided  Performed by:  Arrie Senate, RN

## 2023-06-23 ENCOUNTER — Ambulatory Visit (HOSPITAL_COMMUNITY)
Admission: RE | Admit: 2023-06-23 | Discharge: 2023-06-23 | Disposition: A | Discharge status: Home / Self Care | Payer: Medicare Other | Source: Ambulatory Visit | Attending: Internal Medicine | Admitting: Internal Medicine

## 2023-06-23 DIAGNOSIS — Z1231 Encounter for screening mammogram for malignant neoplasm of breast: Secondary | ICD-10-CM | POA: Insufficient documentation

## 2023-10-04 ENCOUNTER — Other Ambulatory Visit (HOSPITAL_COMMUNITY): Payer: Self-pay | Admitting: Internal Medicine

## 2023-10-04 DIAGNOSIS — M810 Age-related osteoporosis without current pathological fracture: Secondary | ICD-10-CM

## 2023-10-08 ENCOUNTER — Ambulatory Visit (HOSPITAL_COMMUNITY)
Admission: RE | Admit: 2023-10-08 | Discharge: 2023-10-08 | Disposition: A | Discharge status: Home / Self Care | Payer: Medicare Other | Source: Ambulatory Visit | Attending: Internal Medicine | Admitting: Internal Medicine

## 2023-10-08 DIAGNOSIS — M810 Age-related osteoporosis without current pathological fracture: Secondary | ICD-10-CM | POA: Insufficient documentation

## 2023-12-10 ENCOUNTER — Telehealth: Payer: Self-pay

## 2023-12-10 NOTE — Telephone Encounter (Signed)
Auth Submission: NO AUTH NEEDED Site of care: [facility] of care: [facility] Payer: MEDICARE A/B & BCBS SUPP Medication & CPT/J Code(s) submitted: Prolia (Denosumab) [id number] Route of submission (phone, fax, portal):  Phone # Fax # Auth type: Buy/Bill HB Units/visits requested: 60mg  x 2 doses Reference number:  Approval from: 05/30/23 to 11/25/24

## 2023-12-13 ENCOUNTER — Other Ambulatory Visit: Payer: Self-pay

## 2023-12-13 NOTE — H&P (Signed)
 Surgical History & Physical  Patient Name: Kathleen Walker  DOB: [birth year]  Surgery: Cataract extraction with intraocular lens implant phacoemulsification; Right Eye Surgeon: Fabio Pierce MD Surgery Date: 12/20/2023 Pre-Op Date: 12/09/2023  HPI: A 4 Yr. old female patient 1. The patient is here for a Cataract evaluation -OU- OD is worse per pt. Pt. complains of difficulty when reading and close vision. The right eye is affected. The episode is constant. Symptoms occur when the patient is reading. Near vision is worse than distance. The complaint is associated with blurry vision. This is negatively affecting the patient's quality of life and the patient is unable to function adequately in life with the current level of vision.HPI was performed by Fabio Pierce .  Medical History: Cataracts  High Blood Pressure High cholesterol  Review of Systems Cardiovascular High Blood Pressure All recorded systems are negative except as noted above.  Social Never smoked   Medication Lisinopril, Metoprolol tartrate  Sx/Procedures None  Drug Allergies  Sulfa (Sulfonamide Antibiotics)   History & Physical: Heent: cataracts NECK: supple without bruits LUNGS: lungs clear to auscultation CV: regular rate and rhythm Abdomen: soft and non-tender  Impression & Plan: Assessment: 1.  NUCLEAR SCLEROSIS AGE RELATED; Both Eyes (H25.13) 2.  BLEPHARITIS; Right Upper Lid, Right Lower Lid, Left Upper Lid, Left Lower Lid (H01.001, H01.002,H01.004,H01.005) 3.  DERMATOCHALASIS, no surgery; Right Upper Lid, Right Lower Lid, Left Upper Lid, Left Lower Lid (H02.831, H02.832,H02.834,H02.835) 4.  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  ASTIGMATISM, REGULAR; Both Eyes (H52.223)  Plan: 1.  Cataract accounts for the patient's decreased vision. This visual impairment is not correctable with a tolerable change in glasses or contact lenses. Cataract surgery with an implantation of a new lens should  significantly improve the visual and functional status of the patient. Discussed all risks, benefits, alternatives, and potential complications. Discussed the procedures and recovery. Patient desires to have surgery. A-scan ordered and performed today for intra-ocular lens calculations. The surgery will be performed in order to improve vision for driving, reading, and for eye examinations. Recommend phacoemulsification with intra-ocular lens. Recommend Dextenza for post-operative pain and inflammation. Right Eye first. Surgery required to correct imbalance of vision. Consider toric IOL OD only.  2.  Blepharitis- Recommend Artificial tears 4 x day and warm compresses with lid scrubs 2-3 x week. Patient may also benefit from omega-3 vitamin and tear gel/ointment at night. Avoid directing fans or vents on the eyes and consider a humidifier in the winter months. Patient understands this may not cure the problem and may need to be on a maintenance regimen to control the problem.  3.  Asymptomatic, recommend observation for now. Findings, prognosis and treatment options reviewed.  4.  IOP normal. Monitor.  5.  Consider toric IOL OD only.

## 2023-12-14 ENCOUNTER — Telehealth: Payer: Self-pay

## 2023-12-14 NOTE — Patient Instructions (Signed)
 Kathleen Walker  12/14/2023     @PREFPERIOPPHARMACY @   Your procedure is scheduled on December 20, 2023.  [facility] to [facility] at 6:45 [facility].M.  Call this number if you have problems the morning of surgery:  [phone]  If you experience any cold or flu symptoms such as cough, fever, chills, shortness of breath, etc. between now and your scheduled surgery, please notify us at the above number.   Remember:  Do not eat or drink after midnight.  You may drink clear liquids until 4:00 a.m .  Clear liquids allowed are:                     Water, Juice (No red color; non-citric and without pulp; diabetics please choose diet or no sugar options), Carbonated beverages (diabetics please choose diet or no sugar options), Clear Tea (No creamer, milk, or cream, including half & half and powdered creamer), Black Coffee Only (No creamer, milk or cream, including half & half and powdered creamer), Plain Jell-O Only (No red color; diabetics please choose no sugar options), Clear Sports drink (No red color; diabetics please choose diet or no sugar options), and Plain Popsicles Only (No red color; diabetics please choose no sugar options)     Take these medicines the morning of surgery with A SIP OF WATER:               Metoprolol       Do not wear jewelry, make-up or nail polish, including gel polish,  artificial nails, or any other type of covering on natural nails (fingers and  toes).  Do not wear lotions, powders, or perfumes, or deodorant.  Do not shave 48 hours prior to surgery.  Men may shave face and neck.  Do not bring valuables to the hospital.  [facility] is not responsible for any belongings or valuables.  Contacts, dentures or bridgework may not be worn into surgery.  Leave your suitcase in the car.  After surgery it may be brought to your room.  For patients admitted to the hospital, discharge time will be determined by your treatment team.  Patients discharged the day of surgery will  not be allowed to drive home.    Special instructions: Do not smoke or vape for 24 hours prior to procedure.  Please read over the following fact sheets that you were given. Anesthesia Post-op Instructions and Care and Recovery After Surgery      Cataract  A cataract is a buildup of protein that causes the lens of the eye to become cloudy. The lens is normally clear. It is the part of the eye that is behind the iris and pupil. The lens focuses light on the retina, which lets you see clearly. When a lens becomes cloudy, your vision may become blurry. The clouding can range from a tiny dot of blurriness to complete cloudiness. As some cataracts develop, they can make it harder for you to see things that are far away. (You become more nearsighted.) Other cataracts increase glare or cause more problems with reading. Cataracts usually get worse over time, and sometimes the pupil can look white. As cataracts get worse, they cloud more of the lens, making it difficult to see. Cataracts can affect one eye or both eyes. What are the causes? This condition is usually caused by age-related eye changes. The lens of the eye is mostly made up of water and protein. Normally, this protein is arranged in a way  that keeps the lens clear. Cataracts develop when protein begins to clump together over time. This buildup of protein clouds the lens and lets less light pass through to the retina, which causes blurry vision. What increases the risk? You are more likely to develop this condition if you: Are 28 years of age or older. Have diabetes. Take certain medicines for long periods of time, such as steroids or hormone replacement therapy. Have had a previous eye surgery. Have a family history of cataracts. Other conditions associated with developing cataracts include: Having high blood pressure. Having had a previous eye injury. Smoking. Being obese. Drinking alcohol heavily. Having been exposed to large  amounts of radiation, lead, or other toxic substances. Frequently being exposed to sun or very strong light without eye protection. What are the signs or symptoms? The main symptom of a cataract is blurry vision. Your vision may change or get worse over time. Other symptoms include: Increased glare. Seeing a bright ring or halo around light. Poor night vision. Double vision or seeing shadows in one eye or both eyes. Having trouble seeing or reading up close. Seeing colors that appear faded. How is this diagnosed? This condition is diagnosed with a medical history and eye exam. You should see an eye specialist (optometrist or ophthalmologist). Your health care provider may enlarge (dilate) your pupils with eye drops to see the back of your eye more clearly and look for signs of cataracts or other eye problems. You may also have tests, including: A visual acuity test. This uses a chart to determine the smallest letters that you can see from a specific distance. A slit-lamp exam. This uses a microscope to examine small sections of your eye for abnormalities. Tonometry. This test measures the pressure of the fluid inside your eye. Glare testing. This test shines a light in your eye while you view letters to see whether the bright light affects your vision. How is this treated? Treatment depends on the stage of your cataract. You may benefit from: A new prescription for eyeglasses or contact lenses. You can also use stronger light, especially for reading. These are mainly for early-stage cataracts. Having cataract surgery if the condition is severely affecting your vision. This is needed for late-stage cataracts. Follow these instructions at home: Lifestyle Use stronger or brighter lighting. Consider using a magnifying glass for reading or other activities. Become familiar with your surroundings. Having poor vision can put you at greater risk for tripping, falling, or bumping into things. Wear  sunglasses and a hat if you are sensitive to bright light or are having problems with glare. Do not use any products that contain nicotine or tobacco. These products include cigarettes, chewing tobacco, and vaping devices, such as e-cigarettes. If you need help quitting, ask your health care provider. General instructions If you are prescribed new eyeglasses or contacts, wear them as told by your health care provider. Take over-the-counter and prescription medicines only as told by your health care provider. Do not change your medicines unless told to by your health care provider. Do not drive or use machinery if your vision is blurry, especially at night. Keep your blood sugar under control if you have diabetes. Keep all follow-up visits. This is important. Contact a health care provider if: Your symptoms get worse. Your vision affects your ability to perform daily activities, especially driving. You have new symptoms. Get help right away if: You have sudden vision loss. You have redness, swelling, or increasing pain in your eye.  You develop a sudden, severe headache and increased sensitivity to light. Summary A cataract is a buildup of protein that causes the lens of your eye to become cloudy. Cataracts are very common, especially as people age. Mild cataracts cause mild visual symptoms, while more severe cataracts can cause a significant decrease in quality of life. Mild cataracts can often be treated with a prescription for new glasses or contact lenses, while surgery is often recommended for more severe cataracts. Contact a health care provider if your symptoms get worse or your vision affects your ability to do daily activities. Get help right away if you have sudden vision loss, redness, swelling, or increasing pain in the eye, or you develop a sudden, severe headache or increased sensitivity to light. This information is not intended to replace advice given to you by your health care  provider. Make sure you discuss any questions you have with your health care provider. Document Revised: 05/14/2021 Document Reviewed: 05/14/2021 Elsevier Patient Education  2024 [employer].Monitored Anesthesia Care, Care After The following information offers guidance on how to care for yourself after your procedure. Your health care provider may also give you more specific instructions. If you have problems or questions, contact your health care provider. What can I expect after the procedure? After the procedure, it is common to have: Tiredness. Little or no memory about what happened during or after the procedure. Impaired judgment when it comes to making decisions. Nausea or vomiting. Some trouble with balance. Follow these instructions at home: For the time period you were told by your health care provider:  Rest. Do not participate in activities where you could fall or become injured. Do not drive or use machinery. Do not drink alcohol. Do not take sleeping pills or medicines that cause drowsiness. Do not make important decisions or sign legal documents. Do not take care of children on your own. Medicines Take over-the-counter and prescription medicines only as told by your health care provider. If you were prescribed antibiotics, take them as told by your health care provider. Do not stop using the antibiotic even if you start to feel better. Eating and drinking Follow instructions from your health care provider about what you may eat and drink. Drink enough fluid to keep your urine pale yellow. If you vomit: Drink clear fluids slowly and in small amounts as you are able. Clear fluids include water, ice chips, low-calorie sports drinks, and fruit juice that has water added to it (diluted fruit juice). Eat light and bland foods in small amounts as you are able. These foods include bananas, applesauce, rice, lean meats, toast, and crackers. General instructions  Have a  responsible adult stay with you for the time you are told. It is important to have someone help care for you until you are awake and alert. If you have sleep apnea, surgery and some medicines can increase your risk for breathing problems. Follow instructions from your health care provider about wearing your sleep device: When you are sleeping. This includes during daytime naps. While taking prescription pain medicines, sleeping medicines, or medicines that make you drowsy. Do not use any products that contain nicotine or tobacco. These products include cigarettes, chewing tobacco, and vaping devices, such as e-cigarettes. If you need help quitting, ask your health care provider. Contact a health care provider if: You feel nauseous or vomit every time you eat or drink. You feel light-headed. You are still sleepy or having trouble with balance after 24 hours. You get  a rash. You have a fever. You have redness or swelling around the IV site. Get help right away if: You have trouble breathing. You have new confusion after you get home. These symptoms may be an emergency. Get help right away. Call 911. Do not wait to see if the symptoms will go away. Do not drive yourself to the hospital. This information is not intended to replace advice given to you by your health care provider. Make sure you discuss any questions you have with your health care provider. Document Revised: 03/09/2022 Document Reviewed: 03/09/2022 Elsevier Patient Education  2024 [employer].

## 2023-12-14 NOTE — Telephone Encounter (Signed)
 Auth Submission: NO AUTH NEEDED Site of care: [facility] of care: [facility] Payer: Medicare A/B with BCBS supplement Medication & CPT/J Code(s) submitted: Prolia (Denosumab) [id number] Route of submission (phone, fax, portal):  Phone # Fax # Auth type: Buy/Bill HB Units/visits requested: 60mg  x 2 doses Reference number:  Approval from: 12/14/23 to 11/25/24

## 2023-12-16 ENCOUNTER — Encounter (HOSPITAL_COMMUNITY): Payer: Self-pay

## 2023-12-16 ENCOUNTER — Encounter (HOSPITAL_COMMUNITY)
Admission: RE | Admit: 2023-12-16 | Discharge: 2023-12-16 | Disposition: A | Discharge status: Home / Self Care | Payer: Medicare Other | Source: Ambulatory Visit | Attending: Ophthalmology | Admitting: Ophthalmology

## 2023-12-16 ENCOUNTER — Other Ambulatory Visit: Payer: Self-pay

## 2023-12-20 ENCOUNTER — Ambulatory Visit (HOSPITAL_COMMUNITY): Payer: Self-pay | Admitting: Anesthesiology

## 2023-12-20 ENCOUNTER — Encounter (HOSPITAL_COMMUNITY): Payer: Self-pay | Admitting: Ophthalmology

## 2023-12-20 ENCOUNTER — Encounter (HOSPITAL_COMMUNITY)
Admission: RE | Disposition: A | Discharge status: Home / Self Care | Payer: Self-pay | Source: Home / Self Care | Attending: Ophthalmology

## 2023-12-20 ENCOUNTER — Ambulatory Visit (HOSPITAL_COMMUNITY)
Admission: RE | Admit: 2023-12-20 | Discharge: 2023-12-20 | Disposition: A | Discharge status: Home / Self Care | Payer: Medicare Other | Attending: Ophthalmology | Admitting: Ophthalmology

## 2023-12-20 DIAGNOSIS — H2511 Age-related nuclear cataract, right eye: Secondary | ICD-10-CM

## 2023-12-20 DIAGNOSIS — H5711 Ocular pain, right eye: Secondary | ICD-10-CM | POA: Diagnosis not present

## 2023-12-20 DIAGNOSIS — H02831 Dermatochalasis of right upper eyelid: Secondary | ICD-10-CM | POA: Diagnosis not present

## 2023-12-20 DIAGNOSIS — H52223 Regular astigmatism, bilateral: Secondary | ICD-10-CM | POA: Insufficient documentation

## 2023-12-20 DIAGNOSIS — H0100A Unspecified blepharitis right eye, upper and lower eyelids: Secondary | ICD-10-CM | POA: Insufficient documentation

## 2023-12-20 DIAGNOSIS — H02832 Dermatochalasis of right lower eyelid: Secondary | ICD-10-CM | POA: Insufficient documentation

## 2023-12-20 DIAGNOSIS — H02834 Dermatochalasis of left upper eyelid: Secondary | ICD-10-CM | POA: Diagnosis not present

## 2023-12-20 DIAGNOSIS — H18053 Posterior corneal pigmentations, bilateral: Secondary | ICD-10-CM | POA: Insufficient documentation

## 2023-12-20 DIAGNOSIS — H02835 Dermatochalasis of left lower eyelid: Secondary | ICD-10-CM | POA: Diagnosis not present

## 2023-12-20 DIAGNOSIS — H0100B Unspecified blepharitis left eye, upper and lower eyelids: Secondary | ICD-10-CM | POA: Insufficient documentation

## 2023-12-20 DIAGNOSIS — I10 Essential (primary) hypertension: Secondary | ICD-10-CM | POA: Insufficient documentation

## 2023-12-20 SURGERY — CATARACT EXTRACTION PHACO AND INTRAOCULAR LENS PLACEMENT (IOC) with placement of Corticosteroid
Anesthesia: Monitor Anesthesia Care | Site: Eye | Laterality: Right

## 2023-12-20 MED ORDER — POVIDONE-IODINE 5 % OP SOLN
OPHTHALMIC | Status: DC | PRN
  Administered 2023-12-20: 1 via OPHTHALMIC

## 2023-12-20 MED ORDER — SODIUM CHLORIDE 0.9% FLUSH: 3.0000 mL | INTRAVENOUS | Status: DC | PRN

## 2023-12-20 MED ORDER — MOXIFLOXACIN HCL 5 MG/ML IO SOLN
INTRAOCULAR | Status: DC | PRN
  Administered 2023-12-20: .3 mL via INTRACAMERAL

## 2023-12-20 MED ORDER — SODIUM HYALURONATE 23MG/ML IO SOSY
PREFILLED_SYRINGE | INTRAOCULAR | Status: DC | PRN
  Administered 2023-12-20: .6 mL via INTRAOCULAR

## 2023-12-20 MED ORDER — MIDAZOLAM HCL 2 MG/2ML IJ SOLN
INTRAMUSCULAR | Status: AC
  Filled 2023-12-20: qty 2

## 2023-12-20 MED ORDER — ORAL CARE MOUTH RINSE
15.0000 mL | Freq: Once | OROMUCOSAL | Status: DC
Start: 2023-12-20 — End: 2023-12-20

## 2023-12-20 MED ORDER — MIDAZOLAM HCL 2 MG/2ML IJ SOLN
INTRAMUSCULAR | Status: DC | PRN
  Administered 2023-12-20: 1 mg via INTRAVENOUS
  Administered 2023-12-20: .5 mg via INTRAVENOUS

## 2023-12-20 MED ORDER — DEXAMETHASONE 0.4 MG OP INST
VAGINAL_INSERT | OPHTHALMIC | Status: DC | PRN
  Administered 2023-12-20: .4 mg via OPHTHALMIC

## 2023-12-20 MED ORDER — STERILE WATER FOR IRRIGATION IR SOLN
Status: DC | PRN
  Administered 2023-12-20: 1

## 2023-12-20 MED ORDER — TROPICAMIDE 1 % OP SOLN
1.0000 [drp] | OPHTHALMIC | Status: AC | PRN
  Administered 2023-12-20 (×3): 1 [drp] via OPHTHALMIC

## 2023-12-20 MED ORDER — LIDOCAINE HCL (PF) 1 % IJ SOLN
INTRAOCULAR | Status: DC | PRN
  Administered 2023-12-20: 1 mL via OPHTHALMIC

## 2023-12-20 MED ORDER — EPINEPHRINE PF 1 MG/ML IJ SOLN
INTRAOCULAR | Status: DC | PRN
  Administered 2023-12-20: 500 mL

## 2023-12-20 MED ORDER — SODIUM CHLORIDE 0.9% FLUSH: 3.0000 mL | Freq: Two times a day (BID) | INTRAVENOUS | Status: DC

## 2023-12-20 MED ORDER — ORAL CARE MOUTH RINSE: 15.0000 mL | Freq: Once | OROMUCOSAL | Status: DC

## 2023-12-20 MED ORDER — CHLORHEXIDINE GLUCONATE 0.12 % MT SOLN: 15.0000 mL | Freq: Once | OROMUCOSAL | Status: DC

## 2023-12-20 MED ORDER — SODIUM CHLORIDE 0.9% FLUSH
INTRAVENOUS | Status: DC | PRN
  Administered 2023-12-20 (×2): 5 mL via INTRAVENOUS

## 2023-12-20 MED ORDER — TETRACAINE HCL 0.5 % OP SOLN
1.0000 [drp] | OPHTHALMIC | Status: AC | PRN
  Administered 2023-12-20 (×3): 1 [drp] via OPHTHALMIC

## 2023-12-20 MED ORDER — PHENYLEPHRINE HCL 2.5 % OP SOLN
1.0000 [drp] | OPHTHALMIC | Status: AC | PRN
  Administered 2023-12-20 (×3): 1 [drp] via OPHTHALMIC

## 2023-12-20 MED ORDER — SODIUM HYALURONATE 10 MG/ML IO SOLUTION
PREFILLED_SYRINGE | INTRAOCULAR | Status: DC | PRN
  Administered 2023-12-20: .85 mL via INTRAOCULAR

## 2023-12-20 MED ORDER — BSS IO SOLN
INTRAOCULAR | Status: DC | PRN
  Administered 2023-12-20: 15 mL via INTRAOCULAR

## 2023-12-20 MED ORDER — LIDOCAINE HCL 3.5 % OP GEL
1.0000 | Freq: Once | OPHTHALMIC | Status: AC
  Administered 2023-12-20: 1 via OPHTHALMIC

## 2023-12-20 SURGICAL SUPPLY — 12 items
CATARACT SUITE SIGHTPATH (MISCELLANEOUS) ×1 IMPLANT
CLOTH BEACON ORANGE TIMEOUT ST (SAFETY) ×1 IMPLANT
EYE SHIELD UNIVERSAL CLEAR (GAUZE/BANDAGES/DRESSINGS) IMPLANT
FEE CATARACT SUITE SIGHTPATH (MISCELLANEOUS) ×1 IMPLANT
GLOVE BIOGEL PI IND STRL 7.0 (GLOVE) ×2 IMPLANT
LENS IOL TECNIS EYHANCE 13.5 (Intraocular Lens) IMPLANT
NDL HYPO 18GX1.5 BLUNT FILL (NEEDLE) ×1 IMPLANT
NEEDLE HYPO 18GX1.5 BLUNT FILL (NEEDLE) ×1 IMPLANT
PAD ARMBOARD 7.5X6 YLW CONV (MISCELLANEOUS) ×1 IMPLANT
SYR TB 1ML LL NO SAFETY (SYRINGE) ×1 IMPLANT
TAPE SURG TRANSPORE 1 IN (GAUZE/BANDAGES/DRESSINGS) IMPLANT
WATER STERILE IRR 250ML POUR (IV SOLUTION) ×1 IMPLANT

## 2023-12-20 NOTE — Anesthesia Preprocedure Evaluation (Addendum)
 Anesthesia Evaluation  Patient identified by MRN, date of birth, ID band Patient awake    Reviewed: Allergy & Precautions, H&P , NPO status , Patient's Chart, lab work & pertinent test results, reviewed documented beta blocker date and time   Airway Mallampati: II  TM Distance: >3 FB Neck ROM: full    Dental no notable dental hx. (+) Dental Advisory Given   Pulmonary neg pulmonary ROS   Pulmonary exam normal breath sounds clear to auscultation       Cardiovascular Exercise Tolerance: Good hypertension, Normal cardiovascular exam Rhythm:regular Rate:Normal     Neuro/Psych negative neurological ROS  negative psych ROS   GI/Hepatic negative GI ROS, Neg liver ROS,GERD  ,,  Endo/Other  negative endocrine ROS    Renal/GU negative Renal ROS  negative genitourinary   Musculoskeletal   Abdominal   Peds  Hematology negative hematology ROS (+)   Anesthesia Other Findings   Reproductive/Obstetrics negative OB ROS                             Anesthesia Physical Anesthesia Plan  ASA: 2  Anesthesia Plan: MAC   Post-op Pain Management:    Induction:   PONV Risk Score and Plan: Midazolam  Airway Management Planned: Natural Airway and Nasal Cannula  Additional Equipment: None  Intra-op Plan:   Post-operative Plan:   Informed Consent: I have reviewed the patients History and Physical, chart, labs and discussed the procedure including the risks, benefits and alternatives for the proposed anesthesia with the patient or authorized representative who has indicated his/her understanding and acceptance.     Dental Advisory Given  Plan Discussed with: CRNA  Anesthesia Plan Comments:        Anesthesia Quick Evaluation

## 2023-12-20 NOTE — Transfer of Care (Addendum)
 Immediate Anesthesia Transfer of Care Note  Patient: Kathleen Walker  Procedure(s) Performed: CATARACT EXTRACTION PHACO AND INTRAOCULAR LENS PLACEMENT (IOC) with placement of Corticosteroid (Right: Eye)  Patient Location: Short Stay  Anesthesia Type:MAC  Level of Consciousness: awake and patient cooperative  Airway & Oxygen Therapy: Patient Spontanous Breathing  Post-op Assessment: Report given to RN and Post -op Vital signs reviewed and stable  Post vital signs: Reviewed and stable  Last Vitals:  Vitals Value Taken Time  BP 139/53 12/20/23   0824  Temp 36.7 12/20/23   0824  Pulse 56 12/20/23   0824  Resp 16 12/20/23   0824  SpO2 99% 12/20/23   0824    Last Pain:  Vitals:   12/20/23 0744  PainSc: 0-No pain         Complications: No notable events documented.

## 2023-12-20 NOTE — Anesthesia Postprocedure Evaluation (Signed)
 Anesthesia Post Note  Patient: Kathleen Walker  Procedure(s) Performed: CATARACT EXTRACTION PHACO AND INTRAOCULAR LENS PLACEMENT (IOC) with placement of Corticosteroid (Right: Eye)  Patient location during evaluation: PACU Anesthesia Type: MAC Level of consciousness: awake and alert Pain management: pain level controlled Vital Signs Assessment: post-procedure vital signs reviewed and stable Respiratory status: spontaneous breathing, nonlabored ventilation, respiratory function stable and patient connected to nasal cannula oxygen Cardiovascular status: stable and blood pressure returned to baseline Postop Assessment: no apparent nausea or vomiting Anesthetic complications: no   There were no known notable events for this encounter.   Last Vitals:  Vitals:   12/20/23 0745 12/20/23 0824  BP: (!) 160/67 (!) 139/53  Pulse:  (!) 56  Resp: 16 16  Temp: 36.8 C 36.7 C  SpO2: 98% 99%    Last Pain:  Vitals:   12/20/23 0824  TempSrc: Oral  PainSc: 0-No pain                 Salvatore Shear L Wyett Narine

## 2023-12-20 NOTE — Op Note (Signed)
 Date of procedure: 12/20/23  Pre-operative diagnosis:  Visually significant age-related nuclear cataract, Right Eye (H25.11)  Post-operative diagnosis:   1. Visually significant age-related nuclear cataract, Right Eye (H25.11) 2. Pain and inflammation following cataract surgery Right Eye (H57.11)  Procedure:  Removal of cataract via phacoemulsification and insertion of intra-ocular lens [employer] DIB00 +13.5D into the capsular bag of the Right Eye 2. Placement of Dextenza insert, Right Eye  Attending surgeon: Rudy Jew. Kaelyn Nauta, MD, MA  Anesthesia: MAC, Topical Akten  Complications: None  Estimated Blood Loss: <9mL (minimal)  Specimens: None  Implants: As above  Indications:  Visually significant age-related cataract, Right Eye  Procedure:  The patient was seen and identified in the pre-operative area. The operative eye was identified and dilated.  The operative eye was marked.  Topical anesthesia was administered to the operative eye.     The patient was then to the operative suite and placed in the supine position.  A timeout was performed confirming the patient, procedure to be performed, and all other relevant information.   The patient's face was prepped and draped in the usual fashion for intra-ocular surgery.  A lid speculum was placed into the operative eye and the surgical microscope moved into place and focused.  A superotemporal paracentesis was created using a 20 gauge paracentesis blade.  Shugarcaine was injected into the anterior chamber.  Viscoelastic was injected into the anterior chamber.  A temporal clear-corneal main wound incision was created using a 2.44mm microkeratome.  A continuous curvilinear capsulorrhexis was initiated using an irrigating cystitome and completed using capsulorrhexis forceps.  Hydrodissection and hydrodeliniation were performed.  Viscoelastic was injected into the anterior chamber.  A phacoemulsification handpiece and a chopper as a  second instrument were used to remove the nucleus and epinucleus. The irrigation/aspiration handpiece was used to remove any remaining cortical material.   The capsular bag was reinflated with viscoelastic, checked, and found to be intact.  The intraocular lens was inserted into the capsular bag.  The irrigation/aspiration handpiece was used to remove any remaining viscoelastic.  The clear corneal wound and paracentesis wounds were then hydrated and checked with Weck-Cels to be watertight. 0.27mL of moxifloxacin was injected into the anterior chamber.  The lid-speculum was removed. The lower punctum was dilated. A Dextenza implant was placed in the lower canaliculus without complication.  The drape was removed.  The patient's face was cleaned with a wet and dry 4x4. A clear shield was taped over the eye. The patient was taken to the post-operative care unit in good condition, having tolerated the procedure well.  Post-Op Instructions: The patient will follow up at [facility] for a same day post-operative evaluation and will receive all other orders and instructions.

## 2023-12-20 NOTE — Discharge Instructions (Addendum)
 Please discharge patient when stable, will follow up today with Dr. June Leap at the [facility] office immediately following discharge.  Leave shield in place until visit.  All paperwork with discharge instructions will be given at the office.  [facility] Address:  [address]  [place], [place] [address]

## 2023-12-20 NOTE — Addendum Note (Signed)
 Addendum  created 12/20/23 1131 by Ronelle Nigh, MD   Attestation recorded in [place], Intraprocedure Attestations deleted, Intraprocedure Attestations filed

## 2023-12-20 NOTE — Interval H&P Note (Signed)
 History and Physical Interval Note:  12/20/2023 7:52 AM  [name]  has presented today for surgery, with the diagnosis of age related nuclear cataract, right eye.  The various methods of treatment have been discussed with the patient and family. After consideration of risks, benefits and other options for treatment, the patient has consented to  Procedure(s): CATARACT EXTRACTION PHACO AND INTRAOCULAR LENS PLACEMENT (IOC) with placement of Corticosteroid (Right) as a surgical intervention.  The patient's history has been reviewed, patient examined, no change in status, stable for surgery.  I have reviewed the patient's chart and labs.  Questions were answered to the patient's satisfaction.     Kathleen Walker

## 2023-12-28 ENCOUNTER — Encounter: Payer: Medicare Other | Attending: Internal Medicine | Admitting: *Deleted

## 2023-12-28 VITALS — BP 156/81 | HR 53 | Temp 97.9°F | Resp 18

## 2023-12-28 DIAGNOSIS — M810 Age-related osteoporosis without current pathological fracture: Secondary | ICD-10-CM | POA: Insufficient documentation

## 2023-12-28 MED ORDER — DENOSUMAB 60 MG/ML ~~LOC~~ SOSY
60.0000 mg | PREFILLED_SYRINGE | Freq: Once | SUBCUTANEOUS | Status: AC
  Administered 2023-12-28: 60 mg via SUBCUTANEOUS

## 2023-12-28 NOTE — Progress Notes (Signed)
 Diagnosis: Osteoporosis  Provider:  Dwana Melena MD  Procedure: Injection  Prolia (Denosumab), Dose: 60 mg, Site: subcutaneous, Number of injections: 1  Injection Site(s): Right lower quad. abdomne  Post Care: Observation period completed  Discharge: Condition: Good, Destination: Home . AVS Provided  Performed by:  Tonye Pearson, RN

## 2023-12-30 ENCOUNTER — Encounter (HOSPITAL_COMMUNITY)
Admission: RE | Admit: 2023-12-30 | Discharge: 2023-12-30 | Disposition: A | Discharge status: Home / Self Care | Payer: Medicare Other | Source: Ambulatory Visit | Attending: Ophthalmology | Admitting: Ophthalmology

## 2023-12-30 NOTE — H&P (Signed)
 Surgical History & Physical  Patient Name: Kathleen Walker  DOB: [birth year]  Surgery: Cataract extraction with intraocular lens implant phacoemulsification; Left Eye Surgeon: Fabio Pierce MD Surgery Date: 01/03/2024 Pre-Op Date: 12/27/2023  HPI: A 22 Yr. old female patient 1. The patient is returning after cataract surgery. The right eye is affected. Status post cataract surgery, which began 1 week ago: Since the last visit, the affected area is doing well. The patient's vision is improved. Patient is following medication instructions.2. 2. The patient is returning for a cataract follow-up of the left eye. Since the last visit, the affected area is tolerating. The patient's vision is blurry. The condition's severity is constant. The complaint is associated with difficulty driving at night due to halos/glare, difficulty reading traffic and street signs, and difficulty seeing captions on tv. This is negatively affecting the patient's quality of life and the patient is unable to function adequately in life with the current level of vision. HPI Completed by Dr. Fabio Pierce  Medical History: Cataract   High Blood Pressure High cholesterol  Review of Systems Cardiovascular High Blood Pressure All recorded systems are negative except as noted above.  Social Never smoked   Medication Prednisolone-moxiflox-bromfen, Lisinopril, Metoprolol tartrate  Sx/Procedures Phaco c IOL OD - Dextenza  None Drug Allergies  Sulfa (Sulfonamide Antibiotics)   History & Physical: Heent: cataract NECK: supple without bruits LUNGS: lungs clear to auscultation CV: regular rate and rhythm Abdomen: soft and non-tender  Impression & Plan: Assessment: 1.  CATARACT EXTRACTION STATUS; Right Eye (Z98.41) 2.  NUCLEAR SCLEROSIS AGE RELATED; Left Eye (H25.12)  Plan: 1.  1 week after cataract surgery. Doing well with improved vision and normal eye pressure. Call with any problems or concerns. Slightly more  inflammation than usual - will continue drops. Continue Pred-Moxi-Brom 2x/day.  2.  Cataract accounts for the patient's decreased vision. This visual impairment is not correctable with a tolerable change in glasses or contact lenses. Cataract surgery with an implantation of a new lens should significantly improve the visual and functional status of the patient. Discussed all risks, benefits, alternatives, and potential complications. Discussed the procedures and recovery. Patient desires to have surgery. A-scan ordered and performed today for intra-ocular lens calculations. The surgery will be performed in order to improve vision for driving, reading, and for eye examinations. Recommend phacoemulsification with intra-ocular lens. Recommend Dextenza for post-operative pain and inflammation. Left Eye. Surgery required to correct imbalance of vision. Dilates well - shugarcaine by protocol.

## 2024-01-03 ENCOUNTER — Other Ambulatory Visit: Payer: Self-pay

## 2024-01-03 ENCOUNTER — Ambulatory Visit (HOSPITAL_COMMUNITY): Admitting: Certified Registered"

## 2024-01-03 ENCOUNTER — Encounter (HOSPITAL_COMMUNITY): Payer: Self-pay | Admitting: Ophthalmology

## 2024-01-03 ENCOUNTER — Encounter (HOSPITAL_COMMUNITY)
Admission: RE | Disposition: A | Discharge status: Home / Self Care | Payer: Self-pay | Source: Home / Self Care | Attending: Ophthalmology

## 2024-01-03 ENCOUNTER — Ambulatory Visit (HOSPITAL_COMMUNITY)
Admission: RE | Admit: 2024-01-03 | Discharge: 2024-01-03 | Disposition: A | Discharge status: Home / Self Care | Payer: Medicare Other | Attending: Ophthalmology | Admitting: Ophthalmology

## 2024-01-03 DIAGNOSIS — H2512 Age-related nuclear cataract, left eye: Secondary | ICD-10-CM | POA: Insufficient documentation

## 2024-01-03 DIAGNOSIS — Z9841 Cataract extraction status, right eye: Secondary | ICD-10-CM | POA: Diagnosis not present

## 2024-01-03 DIAGNOSIS — K219 Gastro-esophageal reflux disease without esophagitis: Secondary | ICD-10-CM | POA: Insufficient documentation

## 2024-01-03 DIAGNOSIS — I10 Essential (primary) hypertension: Secondary | ICD-10-CM | POA: Insufficient documentation

## 2024-01-03 SURGERY — CATARACT EXTRACTION PHACO AND INTRAOCULAR LENS PLACEMENT (IOC) with placement of Corticosteroid
Anesthesia: Monitor Anesthesia Care | Site: Eye | Laterality: Left

## 2024-01-03 MED ORDER — LIDOCAINE HCL (PF) 1 % IJ SOLN
INTRAOCULAR | Status: DC | PRN
  Administered 2024-01-03: 1 mL via OPHTHALMIC

## 2024-01-03 MED ORDER — BSS IO SOLN
INTRAOCULAR | Status: DC | PRN
  Administered 2024-01-03: 15 mL via INTRAOCULAR

## 2024-01-03 MED ORDER — TETRACAINE HCL 0.5 % OP SOLN
1.0000 [drp] | OPHTHALMIC | Status: AC | PRN
  Administered 2024-01-03 (×3): 1 [drp] via OPHTHALMIC

## 2024-01-03 MED ORDER — STERILE WATER FOR IRRIGATION IR SOLN
Status: DC | PRN
  Administered 2024-01-03: 250 mL

## 2024-01-03 MED ORDER — POVIDONE-IODINE 5 % OP SOLN
OPHTHALMIC | Status: DC | PRN
  Administered 2024-01-03: 1 via OPHTHALMIC

## 2024-01-03 MED ORDER — DEXAMETHASONE 0.4 MG OP INST
VAGINAL_INSERT | OPHTHALMIC | Status: AC
Start: 2024-01-03 — End: ?
  Filled 2024-01-03: qty 1

## 2024-01-03 MED ORDER — MIDAZOLAM HCL 2 MG/2ML IJ SOLN
INTRAMUSCULAR | Status: AC
  Filled 2024-01-03: qty 2

## 2024-01-03 MED ORDER — EPINEPHRINE PF 1 MG/ML IJ SOLN
INTRAOCULAR | Status: DC | PRN
  Administered 2024-01-03: 500 mL

## 2024-01-03 MED ORDER — PHENYLEPHRINE HCL 2.5 % OP SOLN
1.0000 [drp] | OPHTHALMIC | Status: AC | PRN
  Administered 2024-01-03 (×3): 1 [drp] via OPHTHALMIC

## 2024-01-03 MED ORDER — LIDOCAINE HCL 3.5 % OP GEL
1.0000 | Freq: Once | OPHTHALMIC | Status: AC
  Administered 2024-01-03: 1 via OPHTHALMIC

## 2024-01-03 MED ORDER — SODIUM CHLORIDE 0.9% FLUSH
INTRAVENOUS | Status: DC | PRN
  Administered 2024-01-03: 5 mL via INTRAVENOUS

## 2024-01-03 MED ORDER — TROPICAMIDE 1 % OP SOLN
1.0000 [drp] | OPHTHALMIC | Status: AC | PRN
  Administered 2024-01-03 (×3): 1 [drp] via OPHTHALMIC

## 2024-01-03 MED ORDER — DEXAMETHASONE 0.4 MG OP INST
VAGINAL_INSERT | OPHTHALMIC | Status: DC | PRN
  Administered 2024-01-03: .4 mg via OPHTHALMIC

## 2024-01-03 MED ORDER — SIGHTPATH DOSE#1 NA HYALUR & NA CHOND-NA HYALUR IO KIT
PACK | INTRAOCULAR | Status: DC | PRN
  Administered 2024-01-03: 1 via OPHTHALMIC

## 2024-01-03 MED ORDER — MOXIFLOXACIN HCL 5 MG/ML IO SOLN
INTRAOCULAR | Status: DC | PRN
  Administered 2024-01-03: .3 mL via INTRACAMERAL

## 2024-01-03 MED ORDER — MIDAZOLAM HCL 2 MG/2ML IJ SOLN
INTRAMUSCULAR | Status: DC | PRN
Start: 2024-01-03 — End: 2024-01-03
  Administered 2024-01-03: 1 mg via INTRAVENOUS

## 2024-01-03 SURGICAL SUPPLY — 14 items
CATARACT SUITE SIGHTPATH (MISCELLANEOUS) ×1 IMPLANT
CLOTH BEACON ORANGE TIMEOUT ST (SAFETY) ×1 IMPLANT
EYE SHIELD UNIVERSAL CLEAR (GAUZE/BANDAGES/DRESSINGS) IMPLANT
FEE CATARACT SUITE SIGHTPATH (MISCELLANEOUS) ×1 IMPLANT
GLOVE BIOGEL PI IND STRL 6.5 (GLOVE) IMPLANT
GLOVE BIOGEL PI IND STRL 7.0 (GLOVE) ×2 IMPLANT
LENS IOL TECNIS EYHANCE 14.0 (Intraocular Lens) IMPLANT
NDL HYPO 18GX1.5 BLUNT FILL (NEEDLE) ×1 IMPLANT
NEEDLE HYPO 18GX1.5 BLUNT FILL (NEEDLE) ×1 IMPLANT
PAD ARMBOARD 7.5X6 YLW CONV (MISCELLANEOUS) ×1 IMPLANT
SIGHTPATH CAT PROC W REG LENS (Ophthalmic Related) IMPLANT
SYR TB 1ML LL NO SAFETY (SYRINGE) ×1 IMPLANT
TAPE SURG TRANSPORE 1 IN (GAUZE/BANDAGES/DRESSINGS) IMPLANT
WATER STERILE IRR 250ML POUR (IV SOLUTION) ×1 IMPLANT

## 2024-01-03 NOTE — Anesthesia Postprocedure Evaluation (Signed)
 Anesthesia Post Note  Patient: Kathleen Walker  Procedure(s) Performed: CATARACT EXTRACTION PHACO AND INTRAOCULAR LENS PLACEMENT (IOC) with placement of Corticosteroid (Left: Eye)  Patient location during evaluation: Short Stay Anesthesia Type: MAC Level of consciousness: awake and alert Pain management: pain level controlled Vital Signs Assessment: post-procedure vital signs reviewed and stable Respiratory status: spontaneous breathing Cardiovascular status: stable Postop Assessment: no apparent nausea or vomiting Anesthetic complications: no   No notable events documented.   Last Vitals:  Vitals:   01/03/24 0946  BP: (!) 167/64  Pulse: (!) 55  Resp: 13  Temp: 36.9 C  SpO2: 99%    Last Pain:  Vitals:   01/03/24 0946  TempSrc: Oral  PainSc: 0-No pain                 Vinisha Faxon

## 2024-01-03 NOTE — Op Note (Signed)
 Date of procedure: 01/03/24  Pre-operative diagnosis:  Visually significant age-related nuclear cataract, Left Eye (H25.12)  Post-operative diagnosis:   1. Visually significant age-related nuclear cataract, Left Eye (H25.12) 2. Pain and inflammation following cataract surgery Left Eye (H57.12)  Procedure:  Removal of cataract via phacoemulsification and insertion of intra-ocular lens [employer] DIB00 +14.0D into the capsular bag of the Left Eye 2. Placement of Dextenza insert, Left Eye  Attending surgeon: Rudy Jew. Jacarius Handel, MD, MA  Anesthesia: MAC, Topical Akten  Complications: None  Estimated Blood Loss: <67mL (minimal)  Specimens: None  Implants: As above  Indications:  Visually significant age-related cataract, Left Eye  Procedure:  The patient was seen and identified in the pre-operative area. The operative eye was identified and dilated.  The operative eye was marked.  Topical anesthesia was administered to the operative eye.     The patient was then to the operative suite and placed in the supine position.  A timeout was performed confirming the patient, procedure to be performed, and all other relevant information.   The patient's face was prepped and draped in the usual fashion for intra-ocular surgery.  A lid speculum was placed into the operative eye and the surgical microscope moved into place and focused.  An inferotemporal paracentesis was created using a 20 gauge paracentesis blade.  Shugarcaine was injected into the anterior chamber.  Viscoelastic was injected into the anterior chamber.  A temporal clear-corneal main wound incision was created using a 2.87mm microkeratome.  A continuous curvilinear capsulorrhexis was initiated using an irrigating cystitome and completed using capsulorrhexis forceps.  Hydrodissection and hydrodeliniation were performed.  Viscoelastic was injected into the anterior chamber.  A phacoemulsification handpiece and a chopper as a second  instrument were used to remove the nucleus and epinucleus. The irrigation/aspiration handpiece was used to remove any remaining cortical material.   The capsular bag was reinflated with viscoelastic, checked, and found to be intact.  The intraocular lens was inserted into the capsular bag.  The irrigation/aspiration handpiece was used to remove any remaining viscoelastic.  The clear corneal wound and paracentesis wounds were then hydrated and checked with Weck-Cels to be watertight. 0.30mL of moxifloxacin was injected into the anterior chamber.  The lid-speculum was removed. The lower punctum was dilated. A Dextenza implant was placed in the lower canaliculus without complication.  The drape was removed.  The patient's face was cleaned with a wet and dry 4x4. A clear shield was taped over the eye. The patient was taken to the post-operative care unit in good condition, having tolerated the procedure well.  Post-Op Instructions: The patient will follow up at [facility] for a same day post-operative evaluation and will receive all other orders and instructions.

## 2024-01-03 NOTE — Interval H&P Note (Signed)
 History and Physical Interval Note:  01/03/2024 11:55 AM  Kathleen Walker  has presented today for surgery, with the diagnosis of age related nuclear cataract, left eye.  The various methods of treatment have been discussed with the patient and family. After consideration of risks, benefits and other options for treatment, the patient has consented to  Procedure(s): CATARACT EXTRACTION PHACO AND INTRAOCULAR LENS PLACEMENT (IOC) with placement of Corticosteroid (Left) as a surgical intervention.  The patient's history has been reviewed, patient examined, no change in status, stable for surgery.  I have reviewed the patient's chart and labs.  Questions were answered to the patient's satisfaction.     Fabio Pierce

## 2024-01-03 NOTE — Anesthesia Preprocedure Evaluation (Signed)
 Anesthesia Evaluation  Patient identified by MRN, date of birth, ID band Patient awake    Reviewed: Allergy & Precautions, H&P , NPO status , Patient's Chart, lab work & pertinent test results, reviewed documented beta blocker date and time   Airway Mallampati: II  TM Distance: >3 FB Neck ROM: full    Dental no notable dental hx. (+) Dental Advisory Given   Pulmonary neg pulmonary ROS   Pulmonary exam normal breath sounds clear to auscultation       Cardiovascular Exercise Tolerance: Good hypertension, Normal cardiovascular exam Rhythm:regular Rate:Normal     Neuro/Psych negative neurological ROS  negative psych ROS   GI/Hepatic negative GI ROS, Neg liver ROS,GERD  ,,  Endo/Other  negative endocrine ROS    Renal/GU negative Renal ROS  negative genitourinary   Musculoskeletal   Abdominal   Peds  Hematology negative hematology ROS (+)   Anesthesia Other Findings   Reproductive/Obstetrics negative OB ROS                             Anesthesia Physical Anesthesia Plan  ASA: 2  Anesthesia Plan: MAC   Post-op Pain Management:    Induction:   PONV Risk Score and Plan: Midazolam  Airway Management Planned: Natural Airway and Nasal Cannula  Additional Equipment: None  Intra-op Plan:   Post-operative Plan:   Informed Consent: I have reviewed the patients History and Physical, chart, labs and discussed the procedure including the risks, benefits and alternatives for the proposed anesthesia with the patient or authorized representative who has indicated his/her understanding and acceptance.     Dental Advisory Given  Plan Discussed with: CRNA  Anesthesia Plan Comments:        Anesthesia Quick Evaluation

## 2024-01-03 NOTE — Discharge Instructions (Addendum)
 Please discharge patient when stable, will follow up today with Dr. June Leap at the [facility] office immediately following discharge.  Leave shield in place until visit.  All paperwork with discharge instructions will be given at the office.  [facility] Address:  [address]  [place], [place] [address]

## 2024-01-03 NOTE — Transfer of Care (Signed)
 Immediate Anesthesia Transfer of Care Note  Patient: Kathleen Walker  Procedure(s) Performed: CATARACT EXTRACTION PHACO AND INTRAOCULAR LENS PLACEMENT (IOC) with placement of Corticosteroid (Left: Eye)  Patient Location: Short Stay  Anesthesia Type:MAC  Level of Consciousness: awake  Airway & Oxygen Therapy: Patient Spontanous Breathing  Post-op Assessment: Report given to RN  Post vital signs: Reviewed  Last Vitals:  Vitals Value Taken Time  BP    Temp    Pulse    Resp    SpO2      Last Pain:  Vitals:   01/03/24 0946  TempSrc: Oral  PainSc: 0-No pain      Patients Stated Pain Goal: 4 (01/03/24 0946)  Complications: No notable events documented.

## 2024-07-04 ENCOUNTER — Encounter: Attending: Internal Medicine | Admitting: *Deleted

## 2024-07-04 VITALS — BP 170/84 | HR 62 | Temp 97.8°F | Resp 18

## 2024-07-04 DIAGNOSIS — M810 Age-related osteoporosis without current pathological fracture: Secondary | ICD-10-CM | POA: Insufficient documentation

## 2024-07-04 DIAGNOSIS — Z79620 Long term (current) use of immunosuppressive biologic: Secondary | ICD-10-CM | POA: Insufficient documentation

## 2024-07-04 MED ORDER — DENOSUMAB 60 MG/ML ~~LOC~~ SOSY
60.0000 mg | PREFILLED_SYRINGE | Freq: Once | SUBCUTANEOUS | Status: AC
  Administered 2024-07-04: 60 mg via SUBCUTANEOUS

## 2024-07-04 NOTE — Progress Notes (Signed)
 Diagnosis: Osteoporosis  Provider:  Shona Salvo MD  Procedure: Injection  Prolia  (Denosumab ), Dose: 60 mg, Site: subcutaneous, Number of injections: 1  Injection Site(s): Right lower quad. abdomen  Post Care: Observation period completed  Discharge: Condition: Good, Destination: Home . AVS Provided  Performed by:  [place] Boyett Ragsdale, RN

## 2024-08-14 ENCOUNTER — Other Ambulatory Visit (HOSPITAL_COMMUNITY): Payer: Self-pay | Admitting: Internal Medicine

## 2024-08-14 DIAGNOSIS — Z1231 Encounter for screening mammogram for malignant neoplasm of breast: Secondary | ICD-10-CM

## 2024-08-16 ENCOUNTER — Encounter (HOSPITAL_COMMUNITY): Payer: Self-pay

## 2024-08-16 ENCOUNTER — Ambulatory Visit (HOSPITAL_COMMUNITY)
Admission: RE | Admit: 2024-08-16 | Discharge: 2024-08-16 | Disposition: A | Discharge status: Home / Self Care | Source: Ambulatory Visit | Attending: Internal Medicine | Admitting: Internal Medicine

## 2024-08-16 DIAGNOSIS — Z1231 Encounter for screening mammogram for malignant neoplasm of breast: Secondary | ICD-10-CM | POA: Insufficient documentation

## 2025-01-02 ENCOUNTER — Ambulatory Visit
